# Patient Record
Sex: Female | Born: 1970 | Race: White | Hispanic: No | Marital: Married | Smoking: Former smoker
Health system: Southern US, Community
[De-identification: ages and names within clinical notes are randomized; demographics above are authoritative.]

## PROBLEM LIST (undated history)

## (undated) DIAGNOSIS — K219 Gastro-esophageal reflux disease without esophagitis: Secondary | ICD-10-CM

## (undated) DIAGNOSIS — N83202 Unspecified ovarian cyst, left side: Secondary | ICD-10-CM

## (undated) DIAGNOSIS — F32A Depression, unspecified: Secondary | ICD-10-CM

## (undated) DIAGNOSIS — N83201 Unspecified ovarian cyst, right side: Secondary | ICD-10-CM

## (undated) DIAGNOSIS — I10 Essential (primary) hypertension: Secondary | ICD-10-CM

## (undated) DIAGNOSIS — K5792 Diverticulitis of intestine, part unspecified, without perforation or abscess without bleeding: Secondary | ICD-10-CM

## (undated) DIAGNOSIS — Z87442 Personal history of urinary calculi: Secondary | ICD-10-CM

## (undated) DIAGNOSIS — J45909 Unspecified asthma, uncomplicated: Secondary | ICD-10-CM

## (undated) HISTORY — PX: OVARIAN CYST SURGERY: SHX726

## (undated) HISTORY — PX: OTHER SURGICAL HISTORY: SHX169

---

## 1998-07-25 ENCOUNTER — Other Ambulatory Visit: Admission: RE | Admit: 1998-07-25 | Discharge: 1998-07-25 | Payer: Self-pay | Admitting: Obstetrics and Gynecology

## 1999-04-09 ENCOUNTER — Emergency Department (HOSPITAL_COMMUNITY): Admission: EM | Admit: 1999-04-09 | Discharge: 1999-04-09 | Payer: Self-pay | Admitting: *Deleted

## 1999-09-10 ENCOUNTER — Other Ambulatory Visit: Admission: RE | Admit: 1999-09-10 | Discharge: 1999-09-10 | Payer: Self-pay | Admitting: Obstetrics and Gynecology

## 1999-11-06 ENCOUNTER — Encounter: Payer: Self-pay | Admitting: *Deleted

## 1999-11-06 ENCOUNTER — Ambulatory Visit (HOSPITAL_COMMUNITY): Admission: RE | Admit: 1999-11-06 | Discharge: 1999-11-06 | Payer: Self-pay | Admitting: *Deleted

## 2000-11-14 ENCOUNTER — Other Ambulatory Visit: Admission: RE | Admit: 2000-11-14 | Discharge: 2000-11-14 | Payer: Self-pay | Admitting: Obstetrics and Gynecology

## 2001-12-04 ENCOUNTER — Other Ambulatory Visit: Admission: RE | Admit: 2001-12-04 | Discharge: 2001-12-04 | Payer: Self-pay | Admitting: Obstetrics & Gynecology

## 2003-08-17 ENCOUNTER — Other Ambulatory Visit: Admission: RE | Admit: 2003-08-17 | Discharge: 2003-08-17 | Payer: Self-pay | Admitting: Obstetrics & Gynecology

## 2006-10-13 ENCOUNTER — Emergency Department (HOSPITAL_COMMUNITY): Admission: EM | Admit: 2006-10-13 | Discharge: 2006-10-13 | Payer: Self-pay | Admitting: Emergency Medicine

## 2008-07-07 ENCOUNTER — Encounter: Admission: RE | Admit: 2008-07-07 | Discharge: 2008-07-07 | Payer: Self-pay | Admitting: Psychiatry

## 2008-10-26 ENCOUNTER — Emergency Department (HOSPITAL_COMMUNITY): Admission: EM | Admit: 2008-10-26 | Discharge: 2008-10-26 | Payer: Self-pay | Admitting: Emergency Medicine

## 2008-10-28 ENCOUNTER — Encounter: Admission: RE | Admit: 2008-10-28 | Discharge: 2008-10-28 | Payer: Self-pay | Admitting: Internal Medicine

## 2008-11-25 ENCOUNTER — Encounter: Admission: RE | Admit: 2008-11-25 | Discharge: 2008-11-25 | Payer: Self-pay | Admitting: Internal Medicine

## 2010-11-15 LAB — URINALYSIS, ROUTINE W REFLEX MICROSCOPIC
Bilirubin Urine: NEGATIVE
Glucose, UA: NEGATIVE mg/dL
Hgb urine dipstick: NEGATIVE
Ketones, ur: NEGATIVE mg/dL
Nitrite: NEGATIVE
Protein, ur: 30 mg/dL — AB
Specific Gravity, Urine: 1.024 (ref 1.005–1.030)
Urobilinogen, UA: 0.2 mg/dL (ref 0.0–1.0)
pH: 8.5 — ABNORMAL HIGH (ref 5.0–8.0)

## 2010-11-15 LAB — COMPREHENSIVE METABOLIC PANEL
ALT: 14 U/L (ref 0–35)
AST: 16 U/L (ref 0–37)
Albumin: 3.5 g/dL (ref 3.5–5.2)
Alkaline Phosphatase: 58 U/L (ref 39–117)
BUN: 12 mg/dL (ref 6–23)
CO2: 25 mEq/L (ref 19–32)
Calcium: 8.5 mg/dL (ref 8.4–10.5)
Chloride: 103 mEq/L (ref 96–112)
Creatinine, Ser: 0.83 mg/dL (ref 0.4–1.2)
GFR calc Af Amer: >60 mL/min (ref >60)
GFR calc non Af Amer: >60 mL/min (ref >60)
Glucose, Bld: 106 mg/dL — ABNORMAL HIGH (ref 70–99)
Potassium: 3.8 mEq/L (ref 3.5–5.1)
Sodium: 135 mEq/L (ref 135–145)
Total Bilirubin: 0.4 mg/dL (ref 0.3–1.2)
Total Protein: 6.4 g/dL (ref 6.0–8.3)

## 2010-11-15 LAB — WET PREP, GENITAL
Trich, Wet Prep: NONE SEEN
Yeast Wet Prep HPF POC: NONE SEEN

## 2010-11-15 LAB — POCT I-STAT, CHEM 8
BUN: 14 mg/dL (ref 6–23)
Calcium, Ion: 1.01 mmol/L — ABNORMAL LOW (ref 1.12–1.32)
Chloride: 108 mEq/L (ref 96–112)
Creatinine, Ser: 1 mg/dL (ref 0.4–1.2)
Glucose, Bld: 105 mg/dL — ABNORMAL HIGH (ref 70–99)
HCT: 43 % (ref 36.0–46.0)
Hemoglobin: 14.6 g/dL (ref 12.0–15.0)
Potassium: 3.9 mEq/L (ref 3.5–5.1)
Sodium: 138 mEq/L (ref 135–145)
TCO2: 24 mmol/L (ref 0–100)

## 2010-11-15 LAB — CBC
HCT: 41.2 % (ref 36.0–46.0)
Hemoglobin: 14.2 g/dL (ref 12.0–15.0)
MCHC: 34.4 g/dL (ref 30.0–36.0)
MCV: 90.8 fL (ref 78.0–100.0)
Platelets: 196 10*3/uL (ref 150–400)
RBC: 4.53 MIL/uL (ref 3.87–5.11)
RDW: 12.9 % (ref 11.5–15.5)
WBC: 13.1 10*3/uL — ABNORMAL HIGH (ref 4.0–10.5)

## 2010-11-15 LAB — DIFFERENTIAL
Basophils Absolute: 0.1 10*3/uL (ref 0.0–0.1)
Basophils Relative: 1 % (ref 0–1)
Eosinophils Absolute: 0.2 10*3/uL (ref 0.0–0.7)
Eosinophils Relative: 1 % (ref 0–5)
Lymphocytes Relative: 16 % (ref 12–46)
Lymphs Abs: 2.1 10*3/uL (ref 0.7–4.0)
Monocytes Absolute: 1 10*3/uL (ref 0.1–1.0)
Monocytes Relative: 7 % (ref 3–12)
Neutro Abs: 9.7 10*3/uL — ABNORMAL HIGH (ref 1.7–7.7)
Neutrophils Relative %: 74 % (ref 43–77)

## 2010-11-15 LAB — URINE MICROSCOPIC-ADD ON

## 2010-11-15 LAB — GC/CHLAMYDIA PROBE AMP, GENITAL
Chlamydia, DNA Probe: NEGATIVE
GC Probe Amp, Genital: NEGATIVE

## 2010-11-15 LAB — LIPASE, BLOOD: Lipase: 18 U/L (ref 11–59)

## 2010-11-15 LAB — POCT PREGNANCY, URINE: Preg Test, Ur: NEGATIVE

## 2013-01-20 ENCOUNTER — Other Ambulatory Visit: Payer: Self-pay | Admitting: Internal Medicine

## 2013-01-20 DIAGNOSIS — Z1231 Encounter for screening mammogram for malignant neoplasm of breast: Secondary | ICD-10-CM

## 2013-02-22 ENCOUNTER — Ambulatory Visit
Admission: RE | Admit: 2013-02-22 | Discharge: 2013-02-22 | Disposition: A | Discharge status: Home / Self Care | Payer: Commercial Indemnity | Source: Ambulatory Visit | Attending: Internal Medicine | Admitting: Internal Medicine

## 2013-02-22 DIAGNOSIS — Z1231 Encounter for screening mammogram for malignant neoplasm of breast: Secondary | ICD-10-CM

## 2013-10-19 ENCOUNTER — Ambulatory Visit: Payer: Self-pay

## 2013-10-19 ENCOUNTER — Ambulatory Visit (INDEPENDENT_AMBULATORY_CARE_PROVIDER_SITE_OTHER): Payer: Managed Care, Other (non HMO) | Admitting: Podiatry

## 2013-10-19 ENCOUNTER — Encounter: Payer: Self-pay | Admitting: Podiatry

## 2013-10-19 VITALS — BP 119/72 | HR 64 | Resp 16 | Ht 64.0 in | Wt 190.0 lb

## 2013-10-19 DIAGNOSIS — M722 Plantar fascial fibromatosis: Secondary | ICD-10-CM

## 2013-10-19 MED ORDER — DICLOFENAC SODIUM 75 MG PO TBEC: 75.0000 mg | DELAYED_RELEASE_TABLET | Freq: Two times a day (BID) | ORAL | Status: DC

## 2013-10-19 MED ORDER — METHYLPREDNISOLONE (PAK) 4 MG PO TABS: ORAL_TABLET | ORAL | Status: DC

## 2013-10-19 NOTE — Patient Instructions (Signed)
Plantar Fasciitis (Heel Spur Syndrome) with Rehab The plantar fascia is a fibrous, ligament-like, soft-tissue structure that spans the bottom of the foot. Plantar fasciitis is a condition that causes pain in the foot due to inflammation of the tissue. SYMPTOMS   Pain and tenderness on the underneath side of the foot.  Pain that worsens with standing or walking. CAUSES  Plantar fasciitis is caused by irritation and injury to the plantar fascia on the underneath side of the foot. Common mechanisms of injury include:  Direct trauma to bottom of the foot.  Damage to a small nerve that runs under the foot where the main fascia attaches to the heel bone.  Stress placed on the plantar fascia due to bone spurs. RISK INCREASES WITH:   Activities that place stress on the plantar fascia (running, jumping, pivoting, or cutting).  Poor strength and flexibility.  Improperly fitted shoes.  Tight calf muscles.  Flat feet.  Failure to warm-up properly before activity.  Obesity. PREVENTION  Warm up and stretch properly before activity.  Allow for adequate recovery between workouts.  Maintain physical fitness:  Strength, flexibility, and endurance.  Cardiovascular fitness.  Maintain a health body weight.  Avoid stress on the plantar fascia.  Wear properly fitted shoes, including arch supports for individuals who have flat feet. PROGNOSIS  If treated properly, then the symptoms of plantar fasciitis usually resolve without surgery. However, occasionally surgery is necessary. RELATED COMPLICATIONS   Recurrent symptoms that may result in a chronic condition.  Problems of the lower back that are caused by compensating for the injury, such as limping.  Pain or weakness of the foot during push-off following surgery.  Chronic inflammation, scarring, and partial or complete fascia tear, occurring more often from repeated injections. TREATMENT  Treatment initially involves the use of  ice and medication to help reduce pain and inflammation. The use of strengthening and stretching exercises may help reduce pain with activity, especially stretches of the Achilles tendon. These exercises may be performed at home or with a therapist. Your caregiver may recommend that you use heel cups of arch supports to help reduce stress on the plantar fascia. Occasionally, corticosteroid injections are given to reduce inflammation. If symptoms persist for greater than 6 months despite non-surgical (conservative), then surgery may be recommended.  MEDICATION   If pain medication is necessary, then nonsteroidal anti-inflammatory medications, such as aspirin and ibuprofen, or other minor pain relievers, such as acetaminophen, are often recommended.  Do not take pain medication within 7 days before surgery.  Prescription pain relievers may be given if deemed necessary by your caregiver. Use only as directed and only as much as you need.  Corticosteroid injections may be given by your caregiver. These injections should be reserved for the most serious cases, because they may only be given a certain number of times. HEAT AND COLD  Cold treatment (icing) relieves pain and reduces inflammation. Cold treatment should be applied for 10 to 15 minutes every 2 to 3 hours for inflammation and pain and immediately after any activity that aggravates your symptoms. Use ice packs or massage the area with a piece of ice (ice massage).  Heat treatment may be used prior to performing the stretching and strengthening activities prescribed by your caregiver, physical therapist, or athletic trainer. Use a heat pack or soak the injury in warm water. SEEK IMMEDIATE MEDICAL CARE IF:  Treatment seems to offer no benefit, or the condition worsens.  Any medications produce adverse side effects. EXERCISES RANGE   OF MOTION (ROM) AND STRETCHING EXERCISES - Plantar Fasciitis (Heel Spur Syndrome) These exercises may help you  when beginning to rehabilitate your injury. Your symptoms may resolve with or without further involvement from your physician, physical therapist or athletic trainer. While completing these exercises, remember:   Restoring tissue flexibility helps normal motion to return to the joints. This allows healthier, less painful movement and activity.  An effective stretch should be held for at least 30 seconds.  A stretch should never be painful. You should only feel a gentle lengthening or release in the stretched tissue. RANGE OF MOTION - Toe Extension, Flexion  Sit with your right / left leg crossed over your opposite knee.  Grasp your toes and gently pull them back toward the top of your foot. You should feel a stretch on the bottom of your toes and/or foot.  Hold this stretch for __________ seconds.  Now, gently pull your toes toward the bottom of your foot. You should feel a stretch on the top of your toes and or foot.  Hold this stretch for __________ seconds. Repeat __________ times. Complete this stretch __________ times per day.  RANGE OF MOTION - Ankle Dorsiflexion, Active Assisted  Remove shoes and sit on a chair that is preferably not on a carpeted surface.  Place right / left foot under knee. Extend your opposite leg for support.  Keeping your heel down, slide your right / left foot back toward the chair until you feel a stretch at your ankle or calf. If you do not feel a stretch, slide your bottom forward to the edge of the chair, while still keeping your heel down.  Hold this stretch for __________ seconds. Repeat __________ times. Complete this stretch __________ times per day.  STRETCH  Gastroc, Standing  Place hands on wall.  Extend right / left leg, keeping the front knee somewhat bent.  Slightly point your toes inward on your back foot.  Keeping your right / left heel on the floor and your knee straight, shift your weight toward the wall, not allowing your back to  arch.  You should feel a gentle stretch in the right / left calf. Hold this position for __________ seconds. Repeat __________ times. Complete this stretch __________ times per day. STRETCH  Soleus, Standing  Place hands on wall.  Extend right / left leg, keeping the other knee somewhat bent.  Slightly point your toes inward on your back foot.  Keep your right / left heel on the floor, bend your back knee, and slightly shift your weight over the back leg so that you feel a gentle stretch deep in your back calf.  Hold this position for __________ seconds. Repeat __________ times. Complete this stretch __________ times per day. STRETCH  Gastrocsoleus, Standing  Note: This exercise can place a lot of stress on your foot and ankle. Please complete this exercise only if specifically instructed by your caregiver.   Place the ball of your right / left foot on a step, keeping your other foot firmly on the same step.  Hold on to the wall or a rail for balance.  Slowly lift your other foot, allowing your body weight to press your heel down over the edge of the step.  You should feel a stretch in your right / left calf.  Hold this position for __________ seconds.  Repeat this exercise with a slight bend in your right / left knee. Repeat __________ times. Complete this stretch __________ times per day.    STRENGTHENING EXERCISES - Plantar Fasciitis (Heel Spur Syndrome)  These exercises may help you when beginning to rehabilitate your injury. They may resolve your symptoms with or without further involvement from your physician, physical therapist or athletic trainer. While completing these exercises, remember:   Muscles can gain both the endurance and the strength needed for everyday activities through controlled exercises.  Complete these exercises as instructed by your physician, physical therapist or athletic trainer. Progress the resistance and repetitions only as guided. STRENGTH - Towel  Curls  Sit in a chair positioned on a non-carpeted surface.  Place your foot on a towel, keeping your heel on the floor.  Pull the towel toward your heel by only curling your toes. Keep your heel on the floor.  If instructed by your physician, physical therapist or athletic trainer, add ____________________ at the end of the towel. Repeat __________ times. Complete this exercise __________ times per day. STRENGTH - Ankle Inversion  Secure one end of a rubber exercise band/tubing to a fixed object (table, pole). Loop the other end around your foot just before your toes.  Place your fists between your knees. This will focus your strengthening at your ankle.  Slowly, pull your big toe up and in, making sure the band/tubing is positioned to resist the entire motion.  Hold this position for __________ seconds.  Have your muscles resist the band/tubing as it slowly pulls your foot back to the starting position. Repeat __________ times. Complete this exercises __________ times per day.  Document Released: 07/22/2005 Document Revised: 10/14/2011 Document Reviewed: 11/03/2008 [employer] Patient Information 2014 [employer]. Plantar Fasciitis Plantar fasciitis is a common condition that causes foot pain. It is soreness (inflammation) of the band of tough fibrous tissue on the bottom of the foot that runs from the heel bone (calcaneus) to the ball of the foot. The cause of this soreness may be from excessive standing, poor fitting shoes, running on hard surfaces, being overweight, having an abnormal walk, or overuse (this is common in runners) of the painful foot or feet. It is also common in aerobic exercise dancers and ballet dancers. SYMPTOMS  Most people with plantar fasciitis complain of:  Severe pain in the morning on the bottom of their foot especially when taking the first steps out of bed. This pain recedes after a few minutes of walking.  Severe pain is experienced also during walking  following a long period of inactivity.  Pain is worse when walking barefoot or up stairs DIAGNOSIS   Your caregiver will diagnose this condition by examining and feeling your foot.  Special tests such as X-rays of your foot, are usually not needed. PREVENTION   Consult a sports medicine professional before beginning a new exercise program.  Walking programs offer a good workout. With walking there is a lower chance of overuse injuries common to runners. There is less impact and less jarring of the joints.  Begin all new exercise programs slowly. If problems or pain develop, decrease the amount of time or distance until you are at a comfortable level.  Wear good shoes and replace them regularly.  Stretch your foot and the heel cords at the back of the ankle (Achilles tendon) both before and after exercise.  Run or exercise on even surfaces that are not hard. For example, asphalt is better than pavement.  Do not run barefoot on hard surfaces.  If using a treadmill, vary the incline.  Do not continue to workout if you have foot or joint   problems. Seek professional help if they do not improve. HOME CARE INSTRUCTIONS   Avoid activities that cause you pain until you recover.  Use ice or cold packs on the problem or painful areas after working out.  Only take over-the-counter or prescription medicines for pain, discomfort, or fever as directed by your caregiver.  Soft shoe inserts or athletic shoes with air or gel sole cushions may be helpful.  If problems continue or become more severe, consult a sports medicine caregiver or your own health care provider. Cortisone is a potent anti-inflammatory medication that may be injected into the painful area. You can discuss this treatment with your caregiver. MAKE SURE YOU:   Understand these instructions.  Will watch your condition.  Will get help right away if you are not doing well or get worse. Document Released: 04/16/2001 Document  Revised: 10/14/2011 Document Reviewed: 06/15/2008 [employer] Patient Information 2014 [employer].  

## 2013-10-19 NOTE — Progress Notes (Signed)
Still having terrible heel pain in the left heel, tried the meloxicam , and strapped the feet .  Objective: Vital signs are stable alert and oriented x3 pulses are palpable left foot. Pain on palpation medial continued tubercle of the left heel.  Assessment: Plantar fasciitis left.  Plan: Continue conservative therapies reinjected the left heel today put her in a plantar fascial brace night splint.

## 2013-11-16 ENCOUNTER — Ambulatory Visit (INDEPENDENT_AMBULATORY_CARE_PROVIDER_SITE_OTHER): Payer: Managed Care, Other (non HMO) | Admitting: Podiatry

## 2013-11-16 ENCOUNTER — Encounter: Payer: Self-pay | Admitting: Podiatry

## 2013-11-16 VITALS — BP 126/72 | HR 65 | Resp 15 | Ht 64.0 in | Wt 190.0 lb

## 2013-11-16 DIAGNOSIS — M722 Plantar fascial fibromatosis: Secondary | ICD-10-CM

## 2013-11-17 NOTE — Progress Notes (Signed)
She presents today for followup of her plantar fasciitis left states she's approximately 90% better. She continues all conservative therapies.  Objective: Vital signs are stable she is alert and oriented x3. She has no pain on palpation medial continued tubercle of the left heel. Pulses remain palpable there is no calf pain.  Assessment: Plantar fasciitis left foot resolving and 90%.  Plan: Continue all conservative therapies including icing night splint fascial brace shoe gear and anti-inflammatories.

## 2014-08-30 ENCOUNTER — Ambulatory Visit (INDEPENDENT_AMBULATORY_CARE_PROVIDER_SITE_OTHER): Payer: Managed Care, Other (non HMO)

## 2014-08-30 ENCOUNTER — Ambulatory Visit (INDEPENDENT_AMBULATORY_CARE_PROVIDER_SITE_OTHER): Payer: Managed Care, Other (non HMO) | Admitting: Podiatry

## 2014-08-30 VITALS — BP 138/94 | HR 71 | Resp 16

## 2014-08-30 DIAGNOSIS — M722 Plantar fascial fibromatosis: Secondary | ICD-10-CM

## 2014-08-30 MED ORDER — METHYLPREDNISOLONE (PAK) 4 MG PO TABS: ORAL_TABLET | ORAL | Status: DC

## 2014-08-30 MED ORDER — DICLOFENAC SODIUM 75 MG PO TBEC: 75.0000 mg | DELAYED_RELEASE_TABLET | Freq: Two times a day (BID) | ORAL | Status: DC

## 2014-08-30 NOTE — Progress Notes (Signed)
She presents today states that the left heel is acting up with the right one has recently started bothering me as well. I saw her last in April 2015 at which time she was 90% better. She has changed jobs and is now walking in [employer] approximately 9 miles a day.  Objective: Vital signs are stable alert and oriented 3 pulses are palpable bilateral. Pain on palpation medial calcaneal tubercles bilateral. Radiographic findings are consistent with plantar distally oriented calcaneal heel spur and soft tissue increase in density at the plantar fascial calcaneal insertion site indicative of plantar fasciitis right heel.  Assessment: Plantar fasciitis bilateral left greater than right.  Plan: At this point I encouraged her to purchase a pair orthotics which she declined. I injected the bilateral heels today with Kenalog and local anesthetic. We started over with her Medrol Dosepak to be followed by diclofenac. She will continue use the night splint and she was dispensed plantar fascial braces today. We discussed appropriate shoe gear stretching exercise ice therapy and sugar modifications I will follow-up with her in the near future.

## 2014-09-29 ENCOUNTER — Ambulatory Visit: Payer: Managed Care, Other (non HMO) | Admitting: Podiatry

## 2015-03-03 ENCOUNTER — Encounter (HOSPITAL_COMMUNITY): Payer: Self-pay | Admitting: *Deleted

## 2015-03-03 ENCOUNTER — Emergency Department (HOSPITAL_COMMUNITY)
Admission: EM | Admit: 2015-03-03 | Discharge: 2015-03-03 | Disposition: A | Discharge status: Home / Self Care | Payer: Managed Care, Other (non HMO) | Attending: Emergency Medicine | Admitting: Emergency Medicine

## 2015-03-03 ENCOUNTER — Emergency Department (HOSPITAL_COMMUNITY): Payer: Managed Care, Other (non HMO)

## 2015-03-03 DIAGNOSIS — Z791 Long term (current) use of non-steroidal anti-inflammatories (NSAID): Secondary | ICD-10-CM | POA: Insufficient documentation

## 2015-03-03 DIAGNOSIS — R1032 Left lower quadrant pain: Secondary | ICD-10-CM

## 2015-03-03 DIAGNOSIS — Z3202 Encounter for pregnancy test, result negative: Secondary | ICD-10-CM | POA: Insufficient documentation

## 2015-03-03 DIAGNOSIS — N898 Other specified noninflammatory disorders of vagina: Secondary | ICD-10-CM | POA: Insufficient documentation

## 2015-03-03 DIAGNOSIS — K5732 Diverticulitis of large intestine without perforation or abscess without bleeding: Secondary | ICD-10-CM | POA: Diagnosis not present

## 2015-03-03 HISTORY — DX: Unspecified ovarian cyst, right side: N83.202

## 2015-03-03 HISTORY — DX: Diverticulitis of intestine, part unspecified, without perforation or abscess without bleeding: K57.92

## 2015-03-03 HISTORY — DX: Unspecified ovarian cyst, right side: N83.201

## 2015-03-03 LAB — CBC WITH DIFFERENTIAL/PLATELET
Basophils Absolute: 0 10*3/uL (ref 0.0–0.1)
Basophils Relative: 0 % (ref 0–1)
Eosinophils Absolute: 0.1 10*3/uL (ref 0.0–0.7)
Eosinophils Relative: 1 % (ref 0–5)
HCT: 38.2 % (ref 36.0–46.0)
Hemoglobin: 12.8 g/dL (ref 12.0–15.0)
Lymphocytes Relative: 14 % (ref 12–46)
Lymphs Abs: 1.5 10*3/uL (ref 0.7–4.0)
MCH: 30 pg (ref 26.0–34.0)
MCHC: 33.5 g/dL (ref 30.0–36.0)
MCV: 89.7 fL (ref 78.0–100.0)
Monocytes Absolute: 0.8 10*3/uL (ref 0.1–1.0)
Monocytes Relative: 7 % (ref 3–12)
Neutro Abs: 8.9 10*3/uL — ABNORMAL HIGH (ref 1.7–7.7)
Neutrophils Relative %: 78 % — ABNORMAL HIGH (ref 43–77)
Platelets: 218 10*3/uL (ref 150–400)
RBC: 4.26 MIL/uL (ref 3.87–5.11)
RDW: 13.2 % (ref 11.5–15.5)
WBC: 11.4 10*3/uL — ABNORMAL HIGH (ref 4.0–10.5)

## 2015-03-03 LAB — WET PREP, GENITAL
Clue Cells Wet Prep HPF POC: NONE SEEN
Trich, Wet Prep: NONE SEEN
Yeast Wet Prep HPF POC: NONE SEEN

## 2015-03-03 LAB — URINALYSIS, ROUTINE W REFLEX MICROSCOPIC
Bilirubin Urine: NEGATIVE
Glucose, UA: NEGATIVE mg/dL
Hgb urine dipstick: NEGATIVE
Ketones, ur: NEGATIVE mg/dL
Leukocytes, UA: NEGATIVE
Nitrite: NEGATIVE
Protein, ur: NEGATIVE mg/dL
Specific Gravity, Urine: 1.02 (ref 1.005–1.030)
Urobilinogen, UA: 0.2 mg/dL (ref 0.0–1.0)
pH: 5 (ref 5.0–8.0)

## 2015-03-03 LAB — COMPREHENSIVE METABOLIC PANEL
ALT: 14 U/L (ref 14–54)
AST: 15 U/L (ref 15–41)
Albumin: 3.4 g/dL — ABNORMAL LOW (ref 3.5–5.0)
Alkaline Phosphatase: 77 U/L (ref 38–126)
Anion gap: 8 (ref 5–15)
BUN: 8 mg/dL (ref 6–20)
CO2: 25 mmol/L (ref 22–32)
Calcium: 8.8 mg/dL — ABNORMAL LOW (ref 8.9–10.3)
Chloride: 104 mmol/L (ref 101–111)
Creatinine, Ser: 0.8 mg/dL (ref 0.44–1.00)
GFR calc Af Amer: >60 mL/min (ref >60)
GFR calc non Af Amer: >60 mL/min (ref >60)
Glucose, Bld: 92 mg/dL (ref 65–99)
Potassium: 3.6 mmol/L (ref 3.5–5.1)
Sodium: 137 mmol/L (ref 135–145)
Total Bilirubin: 0.7 mg/dL (ref 0.3–1.2)
Total Protein: 6.7 g/dL (ref 6.5–8.1)

## 2015-03-03 LAB — LIPASE, BLOOD: Lipase: 14 U/L — ABNORMAL LOW (ref 22–51)

## 2015-03-03 LAB — PREGNANCY, URINE: Preg Test, Ur: NEGATIVE

## 2015-03-03 MED ORDER — HYDROMORPHONE HCL 1 MG/ML IJ SOLN
1.0000 mg | Freq: Once | INTRAMUSCULAR | Status: AC
  Administered 2015-03-03: 1 mg via INTRAVENOUS
  Filled 2015-03-03: qty 1

## 2015-03-03 MED ORDER — SODIUM CHLORIDE 0.9 % IV BOLUS (SEPSIS)
1000.0000 mL | Freq: Once | INTRAVENOUS | Status: AC
Start: 2015-03-03 — End: 2015-03-03
  Administered 2015-03-03: 1000 mL via INTRAVENOUS

## 2015-03-03 MED ORDER — OXYCODONE-ACETAMINOPHEN 5-325 MG PO TABS: 1.0000 | ORAL_TABLET | Freq: Four times a day (QID) | ORAL | Status: DC | PRN

## 2015-03-03 MED ORDER — ONDANSETRON HCL 4 MG PO TABS: 4.0000 mg | ORAL_TABLET | Freq: Three times a day (TID) | ORAL | Status: DC | PRN

## 2015-03-03 MED ORDER — HYDROMORPHONE HCL 1 MG/ML IJ SOLN: 1.0000 mg | Freq: Once | INTRAMUSCULAR | Status: DC

## 2015-03-03 MED ORDER — CIPROFLOXACIN HCL 500 MG PO TABS: 500.0000 mg | ORAL_TABLET | Freq: Two times a day (BID) | ORAL | Status: DC

## 2015-03-03 MED ORDER — ONDANSETRON HCL 4 MG/2ML IJ SOLN
4.0000 mg | Freq: Once | INTRAMUSCULAR | Status: AC
  Administered 2015-03-03: 4 mg via INTRAVENOUS
  Filled 2015-03-03: qty 2

## 2015-03-03 MED ORDER — METRONIDAZOLE 500 MG PO TABS: 500.0000 mg | ORAL_TABLET | Freq: Three times a day (TID) | ORAL | Status: DC

## 2015-03-03 MED ORDER — IOHEXOL 350 MG/ML SOLN
100.0000 mL | Freq: Once | INTRAVENOUS | Status: AC | PRN
  Administered 2015-03-03: 100 mL via INTRAVENOUS

## 2015-03-03 MED ORDER — SODIUM CHLORIDE 0.9 % IV BOLUS (SEPSIS)
1000.0000 mL | Freq: Once | INTRAVENOUS | Status: AC
  Administered 2015-03-03: 1000 mL via INTRAVENOUS

## 2015-03-03 NOTE — ED Notes (Signed)
Pt states acute onset LLQ pain at 0230 this am.  She states she has a hx of diverticulitis and the pain is in the same place as her typical flare ups.  However, she also has a hx of ovarian cysts and the pain feels more like a cyst rupture.  States decreased appetite x 1 week and green/black stool this am.

## 2015-03-03 NOTE — ED Provider Notes (Signed)
CSN: [id number]     Arrival date & time 03/03/15  1029 History   First MD Initiated Contact with Patient 03/03/15 1110     Chief Complaint  Patient presents with  . Abdominal Pain     (Consider location/radiation/quality/duration/timing/severity/associated sxs/prior Treatment) HPI Patient is a 44 year old female with past medical history of diverticulitis, bilateral ovarian cysts who presents the ER complaining of left lower quadrant abdominal pain. Patient states her symptoms began gradually several days ago, at since persisted. Patient reports an acute worsening of her pain around 2:30 AM this morning. Patient reports dark green diarrhea approximately twice a day for the past several days and she has been expressing pain as well. Patient reports mild nausea without vomiting. Patient states she's had diverticulitis in the past which has felt somewhat similar, as well as ovarian cysts. Patient states this pain feels more similar to her ovarian cysts she has experienced in the past. Patient denies chest pain, shortness of breath, fever, chills, dysuria, hematochezia, melena.  Past Medical History  Diagnosis Date  . Diverticulitis   . Bilateral ovarian cysts    History reviewed. No pertinent past surgical history. No family history on file. History  Substance Use Topics  . Smoking status: Never Smoker   . Smokeless tobacco: Not on file  . Alcohol Use: Yes     Comment: social    OB History    No data available     Review of Systems  Constitutional: Negative for fever.  HENT: Negative for trouble swallowing.   Eyes: Negative for visual disturbance.  Respiratory: Negative for shortness of breath.   Cardiovascular: Negative for chest pain.  Gastrointestinal: Positive for nausea, abdominal pain and diarrhea. Negative for vomiting.  Genitourinary: Negative for dysuria.  Musculoskeletal: Negative for neck pain.  Skin: Negative for rash.  Neurological: Negative for dizziness, weakness  and numbness.  Psychiatric/Behavioral: Negative.     Allergies  Zithromax  Home Medications   Prior to Admission medications   Medication Sig Start Date End Date Taking? Authorizing Provider  ciprofloxacin (CIPRO) 500 MG tablet Take 1 tablet (500 mg total) by mouth 2 (two) times daily. One po bid x 7 days 03/03/15   Ladona Mow, PA-C  diclofenac (VOLTAREN) 75 MG EC tablet Take 1 tablet (75 mg total) by mouth 2 (two) times daily. 08/30/14   Max T Hyatt, DPM  methylPREDNIsolone (MEDROL DOSPACK) 4 MG tablet follow package directions 08/30/14   Max T Hyatt, DPM  metroNIDAZOLE (FLAGYL) 500 MG tablet Take 1 tablet (500 mg total) by mouth 3 (three) times daily. 03/03/15   Ladona Mow, PA-C  ondansetron (ZOFRAN) 4 MG tablet Take 1 tablet (4 mg total) by mouth every 8 (eight) hours as needed. 03/03/15   Ladona Mow, PA-C  oxyCODONE-acetaminophen (PERCOCET) 5-325 MG per tablet Take 1-2 tablets by mouth every 6 (six) hours as needed. 03/03/15   Ladona Mow, PA-C   BP 120/67 mmHg  Pulse 70  Temp(Src) 98.3 F (36.8 C) (Oral)  Resp 16  Ht  (1.626 m)  Wt 200 lb (90.719 kg)  BMI 34.31 kg/m2  SpO2 99%  LMP 02/20/2015 Physical Exam  Constitutional: She is oriented to person, place, and time. She appears well-developed and well-nourished. No distress.  HENT:  Head: Normocephalic and atraumatic.  Mouth/Throat: Oropharynx is clear and moist. No oropharyngeal exudate.  Eyes: Right eye exhibits no discharge. Left eye exhibits no discharge. No scleral icterus.  Neck: Normal range of motion.  Cardiovascular: Normal rate, regular rhythm  and normal heart sounds.   No murmur heard. Pulmonary/Chest: Effort normal and breath sounds normal. No respiratory distress.  Abdominal: Soft. Normal appearance. There is tenderness in the left lower quadrant. There is no rigidity, no guarding, no tenderness at McBurney's point and negative Murphy's sign.  Genitourinary: There is no rash, tenderness, lesion or injury on the  right labia. There is no rash, tenderness, lesion or injury on the left labia. Right adnexum displays no mass, no tenderness and no fullness. Left adnexum displays no mass, no tenderness and no fullness. No erythema, tenderness or bleeding in the vagina. No foreign body around the vagina. No signs of injury around the vagina. Vaginal discharge found.  Small amount of white colored discharge noted in vaginal vault. No cervical motion tenderness, friability or discharge. No adnexal tenderness. Chaperone present during entire pelvic exam.  Musculoskeletal: Normal range of motion. She exhibits no edema or tenderness.  Neurological: She is alert and oriented to person, place, and time. No cranial nerve deficit. Coordination normal.  Skin: Skin is warm and dry. No rash noted. She is not diaphoretic.  Psychiatric: She has a normal mood and affect.  Nursing note and vitals reviewed.   ED Course  Procedures (including critical care time) Labs Review Labs Reviewed  WET PREP, GENITAL - Abnormal; Notable for the following:    WBC, Wet Prep HPF POC FEW (*)    All other components within normal limits  URINALYSIS, ROUTINE W REFLEX MICROSCOPIC (NOT AT [facility]) - Abnormal; Notable for the following:    APPearance CLOUDY (*)    All other components within normal limits  CBC WITH DIFFERENTIAL/PLATELET - Abnormal; Notable for the following:    WBC 11.4 (*)    Neutrophils Relative % 78 (*)    Neutro Abs 8.9 (*)    All other components within normal limits  COMPREHENSIVE METABOLIC PANEL - Abnormal; Notable for the following:    Calcium 8.8 (*)    Albumin 3.4 (*)    All other components within normal limits  LIPASE, BLOOD - Abnormal; Notable for the following:    Lipase 14 (*)    All other components within normal limits  PREGNANCY, URINE  LIPASE, BLOOD  COMPREHENSIVE METABOLIC PANEL  GC/CHLAMYDIA PROBE AMP ([place]) NOT AT [facility]    Imaging Review Ct Abdomen Pelvis W Contrast  03/03/2015   CLINICAL  DATA:  44 year old female with acute left abdominal and pelvic pain today.  EXAM: CT ABDOMEN AND PELVIS WITH CONTRAST  TECHNIQUE: Multidetector CT imaging of the abdomen and pelvis was performed using the standard protocol following bolus administration of intravenous contrast.  CONTRAST:  OMNIPAQUE IOHEXOL 350 MG/ML SOLN  COMPARISON:  10/28/2008 and prior CTs  FINDINGS: Lower chest:  Unremarkable  Hepatobiliary: The gallbladder and liver are unremarkable. There is no evidence of biliary dilatation.  Pancreas: Unremarkable  Spleen: Unremarkable  Adrenals/Urinary Tract: The kidneys, adrenal glands and bladder are unremarkable.  Stomach/Bowel: There is focal wall thickening and adjacent inflammation of the descending descending colon as well as the proximal sigmoid colon, compatible with diverticulitis. There is no evidence of abscess, pneumoperitoneum or bowel obstruction. A trace amount of free pelvic and right paracolic gutter fluid noted.  Vascular/Lymphatic: No enlarged lymph nodes or abdominal aortic aneurysm. Aortic atherosclerotic calcifications noted.  Reproductive: Uterus and adnexal regions are unremarkable.  Other: Trace amount of free fluid noted.  Musculoskeletal: No acute or suspicious abnormalities.  IMPRESSION: Focal wall thickening and adjacent inflammation of the distal descending and  proximal sigmoid colon compatible with diverticulitis. Trace amount of free pelvic and right paracolic gutter fluid. No evidence of pneumoperitoneum, abscess or bowel obstruction.  Aortic atherosclerotic disease.   Electronically Signed   By: Harmon Pier M.D.   On: 03/03/2015 15:21     EKG Interpretation None      MDM   Final diagnoses:  LLQ abdominal pain  Diverticulitis large intestine w/o perforation or abscess w/o bleeding    Patient left lower quadrant abdominal pain stating her symptoms are consistent with either diverticulitis versus ovarian cyst. Patient stating she is unsure of which her  symptoms feel more like. Pelvic exam unremarkable for any adnexal tenderness, will follow with CT abdomen pelvis to evaluate for diverticular disease.  CT with impression: Focal wall thickening and adjacent inflammation of the distal descending and proximal sigmoid colon compatible with diverticulitis. Trace amount of free pelvic and right paracolic gutter fluid. No evidence of pneumoperitoneum, abscess or bowel obstruction.  Aortic atherosclerotic disease.  Patient's Symptoms Consistent with Diverticulitis. This Confirmed by CT. Labs are unremarkable for acute pathology. No leukocytosis or anemia. No hematochezia or melena by history. No concern for diverticular bleed. Patient afebrile, hemodynamically stable and in no acute distress. Symptoms are controlled here, patient tolerating by mouth well. No concern for sepsis or SIRS. Patient is stable for discharge to continue antibiotic therapy as outpatient. Discussed return precautions with patient, and strongly encouraged patient follow up PCP. Patient verbalizes understanding and agreement of this plan.  BP 120/67 mmHg  Pulse 70  Temp(Src) 98.3 F (36.8 C) (Oral)  Resp 16  Ht 5\' 4"  (1.626 m)  Wt 200 lb (90.719 kg)  BMI 34.31 kg/m2  SpO2 99%  LMP 02/20/2015  Signed,  Ladona Mow, PA-C 4:40 PM  Patient discussed with Dr Nelva Nay, MD   Ladona Mow, PA-C 03/03/15 1640  Nelva Nay, MD 03/04/15 [phone]

## 2015-03-03 NOTE — Discharge Instructions (Signed)

## 2015-03-06 LAB — GC/CHLAMYDIA PROBE AMP (~~LOC~~) NOT AT ARMC
Chlamydia: NEGATIVE
Neisseria Gonorrhea: NEGATIVE

## 2015-06-05 ENCOUNTER — Encounter: Payer: Self-pay | Admitting: Podiatry

## 2015-06-05 ENCOUNTER — Ambulatory Visit (INDEPENDENT_AMBULATORY_CARE_PROVIDER_SITE_OTHER): Payer: Managed Care, Other (non HMO) | Admitting: Podiatry

## 2015-06-05 DIAGNOSIS — M629 Disorder of muscle, unspecified: Secondary | ICD-10-CM

## 2015-06-05 DIAGNOSIS — M722 Plantar fascial fibromatosis: Secondary | ICD-10-CM | POA: Diagnosis not present

## 2015-06-05 NOTE — Progress Notes (Signed)
Subjective:    Patient ID: Sandra Ashley, female    DOB: 1971-07-29, 44 y.o.   MRN: [id number]  HPI   44 year old female presents the office today for concerns her right foot pain. She was referred to me by Dr. Elijah Birk for surgical consultation. She states that she's had pain to her heel on the right second greater than 1 year. She was previously treated by Dr. Al Corpus for plantar fasciitis in which she had steroid injections. She is also tried stretching, icing, anti-inflammatories, steroids. In July  Of this year while at work she did have an injury for which she got on her toes and she felt a ripping sensation within the arch of her foot. She followed up with Dr. Elijah Birk at that time. She was told that she may have torn the ligament and she fractured her heel spur. She is placed into boot for about a week at that time. She is also had subsequent steroid injections and to the area. She states that she continues to have pain to the right heel throughout the day which gets worse as she walks on it. She has also had inserts which  Helped some. She states her pain is not as much today and she's been off her feet for the last week from work. No recent injury or trauma. No other complaints at this time.     Review of Systems  All other systems reviewed and are negative.      Objective:   Physical Exam General: AAO x3, NAD  Dermatological: Skin is warm, dry and supple bilateral. Nails x 10 are well manicured; remaining integument appears unremarkable at this time. There are no open sores, no preulcerative lesions, no rash or signs of infection present.  Vascular: Dorsalis Pedis artery and Posterior Tibial artery pedal pulses are 2/4 bilateral with immedate capillary fill time. Pedal hair growth present. No varicosities and no lower extremity edema present bilateral. There is no pain with calf compression, swelling, warmth, erythema.   Neruologic: Grossly intact via light touch bilateral. Vibratory intact  via tuning fork bilateral. Protective threshold with Semmes Wienstein monofilament intact to all pedal sites bilateral. Patellar and Achilles deep tendon reflexes 2+ bilateral. No Babinski or clonus noted bilateral.   Musculoskeletal: No gross boney pedal deformities bilateral. There is tenderness with patient on the plantar medial tubercle of the calcaneus at the insertion of the plantar fascia on the right foot. There is also mild discomfort on the medial band of the plantar fascia within the arch of foot on the right side. Upon dorsiflexion of the hallux the plantar fascia does not appear to be as taught compared to the left side. This concern for possible plantar fascial tear. There is no pain with lateral compression of the calcaneus. There is no pain on the course last insertion of the Achilles tendon. Mild equinus is present. No pain, crepitus, or limitation noted with foot and ankle range of motion bilateral. Muscular strength 5/5 in all groups tested bilateral.  Gait: Unassisted, Nonantalgic.      Assessment & Plan:  44 year old female with right heel pain , plantar fasciitis versus possible tear. -Treatment options discussed including all alternatives, risks, and complications -X-rays in Dr. Mercy Riding office were reviewed. -I'm concerned that given her history as well as clinical findings that she may have a plantar fascial tear. Because of this I ordered an MRI. This is for surgical planning as well. -Cue to the amount of pain now also recommend her to  continue with her CAM boot for now. She states that she can work and numbness. -Continue icing.  -Follow-up after MRI or sooner if any problems arise. In the meantime, encouraged to call the office with any questions, concerns, change in symptoms.  I did discuss both conservative and surgical treatment options however we will await the results of the MRI before proceeding with further treatment.  Ovid CurdMatthew Elayjah Chaney, DPM

## 2015-06-07 ENCOUNTER — Telehealth: Payer: Self-pay | Admitting: *Deleted

## 2015-06-07 NOTE — Telephone Encounter (Addendum)
Prior authorization started, faxed medical notes 06/05/2015, 08/30/2014 and x-ray report 08/30/2014 with CASE# [id number].  [facility] w/o contrast of right foot.  APPEAL PROCESS STARTED, documentation 08/30/2014 and 06/05/2015, x-ray report -08/30/2014, copy of the front of pt's insurance id card and 08/12/2014 letter by Dr. Ardelle AntonWagoner justifying the necessity for the MRI of the right foot, MAILED TO:  [facility], Attn: Appeals, [address], [address], [place], [place] [id number].  Left message informing pt to cancel MRI scheduled for 06/19/2015.  Pt asked if we had heard anything about the MRI approval and if not what else could she do for her discomfort.  Left message with Dr. Gabriel RungWagoner's orders and called in refills of Diclofenac to [facility] on [address].

## 2015-06-13 ENCOUNTER — Encounter: Payer: Self-pay | Admitting: Podiatry

## 2015-06-19 ENCOUNTER — Other Ambulatory Visit: Payer: Commercial Indemnity

## 2015-06-19 NOTE — Telephone Encounter (Signed)
I would stay in the CAM boot (which I believe that she has), NSAIDs if she can, ice

## 2015-06-20 ENCOUNTER — Telehealth: Payer: Self-pay | Admitting: *Deleted

## 2015-06-20 MED ORDER — DICLOFENAC SODIUM 75 MG PO TBEC
75.0000 mg | DELAYED_RELEASE_TABLET | Freq: Two times a day (BID) | ORAL | Status: DC
Start: 2015-06-20 — End: 2021-01-11

## 2015-06-20 NOTE — Telephone Encounter (Signed)
She has had all this stuff. She was previously seen by Dr. Al CorpusHyatt and Dr. Elijah Birkom

## 2015-06-20 NOTE — Telephone Encounter (Addendum)
[facility] [employer] denied the initial approval request and the appeal due to not enough physician directed PT, oral and injectable medications - steroid, ANSAIDS and narcotic pain management, immobilization and RICE, evident in the clinicals. Dr. Ardelle AntonWagoner states have pt come in to discuss other options.  Informed pt and transferred to the schedulers.

## 2015-06-21 ENCOUNTER — Telehealth: Payer: Self-pay | Admitting: *Deleted

## 2015-06-21 NOTE — Telephone Encounter (Signed)
[employer] letter dated 06/20/2015 at 6:38 pm, gives approval for MRI [id number], Reference [id number].

## 2015-06-22 ENCOUNTER — Telehealth: Payer: Self-pay | Admitting: *Deleted

## 2015-06-22 NOTE — Telephone Encounter (Addendum)
-----   Message from Vivi BarrackMatthew R Wagoner, DPM sent at 06/21/2015  6:56 PM EST ----- Regarding: RE: MRI approval Yes, and see me after the MRI. Thanks.  ----- Message -----    From: Marissa NestleValery D O'Connell, RN    Sent: 06/21/2015   8:38 AM      To: Vivi BarrackMatthew R Wagoner, DPM Subject: MRI approval                                   Dr. Ardelle AntonWagoner,  I swear to you, when I called the [employer] they denied the 1. Approval request - when all the clinicals were sent, then 2. Appeal request - when your letter and clinicals were sent, they gave me 6 different reasons.  Now I get a faxed letter 06/20/2015 at 638pm stating the MRI is approved.  Do you want me to get pt to have this even though she now has an appt?  Joya SanValery Faxed letter from [employer] dated 06/20/2015 at 6:38pm and [facility] pt [facility], so she can schedule MRI.  Original of the [employer] approval letter sent to be scanned into pt's record.

## 2015-06-22 NOTE — Telephone Encounter (Signed)
[employer] approval letter fax dated 06/20/2015 at 6:38pm, for MRI Right foot [id number], REFERENCE NUMBER: [id number], VALID 06/07/2015 TO 09/05/2015.  FAXED COPY OF LETTER TO [facility] WITH ORDERS AND SENT ORIGINAL TO BE SCANNED TO PT RECORD.

## 2015-07-07 ENCOUNTER — Ambulatory Visit
Admission: RE | Admit: 2015-07-07 | Discharge: 2015-07-07 | Disposition: A | Discharge status: Home / Self Care | Payer: Managed Care, Other (non HMO) | Source: Ambulatory Visit | Attending: Podiatry | Admitting: Podiatry

## 2015-07-07 DIAGNOSIS — M722 Plantar fascial fibromatosis: Secondary | ICD-10-CM

## 2015-07-14 ENCOUNTER — Encounter: Payer: Self-pay | Admitting: Podiatry

## 2015-07-14 ENCOUNTER — Ambulatory Visit (INDEPENDENT_AMBULATORY_CARE_PROVIDER_SITE_OTHER): Payer: Managed Care, Other (non HMO) | Admitting: Podiatry

## 2015-07-14 VITALS — BP 114/68 | HR 63 | Resp 18

## 2015-07-14 DIAGNOSIS — M722 Plantar fascial fibromatosis: Secondary | ICD-10-CM | POA: Diagnosis not present

## 2015-07-14 DIAGNOSIS — M629 Disorder of muscle, unspecified: Secondary | ICD-10-CM | POA: Diagnosis not present

## 2015-07-14 NOTE — Patient Instructions (Signed)

## 2015-07-17 ENCOUNTER — Ambulatory Visit: Payer: Managed Care, Other (non HMO) | Admitting: Podiatry

## 2015-07-20 DIAGNOSIS — M629 Disorder of muscle, unspecified: Secondary | ICD-10-CM | POA: Insufficient documentation

## 2015-07-20 NOTE — Progress Notes (Signed)
Patient ID: [name], female   DOB: Mar 19, 1971, 44 y.o.   MRN: [id number]  Subjective: Patient presents the office they for follow-up evaluation of right heel pain as well as to discuss MRI results and continuation of treatment. She states that she can views of pain to her heel. This has been ongoing for written one year and did worsen over the summer after an injury. She states that she continue to have pain on a daily basis that her heel she has difficulty walking.  Denies any systemic complaints such as fevers, chills, nausea, vomiting. No acute changes since last appointment, and no other complaints at this time.   Objective: AAO x3, NAD DP/PT pulses palpable bilaterally, CRT less than 3 seconds Protective sensation intact with Simms Weinstein monofilament, vibratory sensation intact, Achilles tendon reflex intact Tenderness to palpation along the plantar medial tubercle of the calcaneus at the insertion of plantar fascia on the right foot. There is no pain along the course of the plantar fascia within the arch of the foot. There is no pain with lateral compression of the calcaneus or pain with vibratory sensation. There is no pain along the course or insertion of the achilles tendon. No other areas of tenderness to bilateral lower extremities. MMT 5/5, ROM WNL. No edema, erythema, increase in warmth to bilateral lower extremities.  No open lesions or pre-ulcerative lesions.  No pain with calf compression, swelling, warmth, erythema  Assessment:  44 year old female plantar fascial partial thickness tear on the medial band and full-thickness tear involving central slip.  Plan: -All treatment options discussed with the patient including all alternatives, risks, complications.  - MRI studies were discussed with the patient. - at this time she has had ongoing symptoms for 6 months without any resolution despite conservative treatment. I discussed with her surgical intervention  Involving to  completely release the medial band of the plantar fascia as it was a partial-thickness tear. She also like to proceed with heel spur resection. -The incision placement as well as the postoperative course was discussed with the patient. I discussed risks of the surgery which include, but not limited to, infection, bleeding, pain, swelling, need for further surgery, delayed or nonhealing, painful or ugly scar, numbness or sensation changes, over/under correction, recurrence, transfer lesions, further deformity, hardware failure, DVT/PE, loss of toe/foot. Patient understands these risks and wishes to proceed with surgery. The surgical consent was reviewed with the patient all 3 pages were signed. No promises or guarantees were given to the outcome of the procedure. All questions were answered to the best of my ability. Before the surgery the patient was encouraged to call the office if there is any further questions. [facility] basis.  Ovid CurdMatthew Wagoner, DPM

## 2015-07-26 DIAGNOSIS — M7731 Calcaneal spur, right foot: Secondary | ICD-10-CM | POA: Diagnosis not present

## 2015-07-26 DIAGNOSIS — M722 Plantar fascial fibromatosis: Secondary | ICD-10-CM | POA: Diagnosis not present

## 2015-07-27 ENCOUNTER — Telehealth: Payer: Self-pay | Admitting: *Deleted

## 2015-07-27 NOTE — Telephone Encounter (Signed)
CALLED PATIENT TO SEE HOW SHE WAS DOING AFTER SURGERY AND PATIENT STATED THAT THE BLOCK WAS WEARING OFF BUT WAS DOING GOOD AND THAT WE WOULD SEE HER ON 08-01-15 AT 2:15 PM. Dshawn Mcnay

## 2015-08-01 ENCOUNTER — Other Ambulatory Visit: Payer: Self-pay

## 2015-08-01 DIAGNOSIS — M722 Plantar fascial fibromatosis: Secondary | ICD-10-CM

## 2015-08-04 ENCOUNTER — Ambulatory Visit (INDEPENDENT_AMBULATORY_CARE_PROVIDER_SITE_OTHER): Payer: Managed Care, Other (non HMO)

## 2015-08-04 ENCOUNTER — Ambulatory Visit (INDEPENDENT_AMBULATORY_CARE_PROVIDER_SITE_OTHER): Payer: Managed Care, Other (non HMO) | Admitting: Podiatry

## 2015-08-04 VITALS — Temp 98.5°F

## 2015-08-04 DIAGNOSIS — M722 Plantar fascial fibromatosis: Secondary | ICD-10-CM

## 2015-08-04 DIAGNOSIS — Z09 Encounter for follow-up examination after completed treatment for conditions other than malignant neoplasm: Secondary | ICD-10-CM

## 2015-08-07 DIAGNOSIS — M722 Plantar fascial fibromatosis: Secondary | ICD-10-CM | POA: Insufficient documentation

## 2015-08-07 NOTE — Progress Notes (Signed)
Patient ID: Sandra PaliMaggie Ashley, female   DOB: 1970/12/04, 45 y.o.   MRN: [id number]  Subjective: Sandra Ashley is a 45 y.o. is seen today in office s/p right EPF and heel spur resection. They state their pain is improving. She is stinging burning pain to the arch of the foot. She has finished antibiotics. She is continuing the CAM boot. Denies any systemic complaints such as fevers, chills, nausea, vomiting. No calf pain, chest pain, shortness of breath.   Objective: General: No acute distress, AAOx3  DP/PT pulses palpable 2/4, CRT < 3 sec to all digits.  Protective sensation intact. Motor function intact.  Right foot: Incision is well coapted without any evidence of dehiscence and sutures intact. There is no surrounding erythema, ascending cellulitis, fluctuance, crepitus, malodor, drainage/purulence. There is mild edema around the surgical site. There is mild pain along the surgical site.  No other areas of tenderness to bilateral lower extremities.  No other open lesions or pre-ulcerative lesions.  No pain with calf compression, swelling, warmth, erythema.   Assessment and Plan:  Status post right foot surgery, doing well with no complications with some post-operative pain  -X-rays were obtained and reviewed with the patient.  -Treatment options discussed including all alternatives, risks, and complications -Ice/elevation -Pain medication as needed. -Continue cam boot at all times even at night. -Monitor for any clinical signs or symptoms of infection and DVT/PE and directed to call the office immediately should any occur or go to the ER. -Follow-up 1 week for possible suture removal or sooner if any problems arise. In the meantime, encouraged to call the office with any questions, concerns, change in symptoms.   Ovid CurdMatthew Garret Teale, DPM

## 2015-08-08 ENCOUNTER — Ambulatory Visit: Payer: Self-pay | Admitting: Podiatry

## 2015-08-11 ENCOUNTER — Ambulatory Visit (INDEPENDENT_AMBULATORY_CARE_PROVIDER_SITE_OTHER): Payer: Managed Care, Other (non HMO) | Admitting: Podiatry

## 2015-08-11 DIAGNOSIS — M722 Plantar fascial fibromatosis: Secondary | ICD-10-CM

## 2015-08-11 DIAGNOSIS — Z09 Encounter for follow-up examination after completed treatment for conditions other than malignant neoplasm: Secondary | ICD-10-CM

## 2015-08-13 NOTE — Progress Notes (Signed)
Patient ID: Sandra Ashley, female   DOB: 1971/01/20, 45 y.o.   MRN: [id number]  Subjective: Sandra Ashley is a 45 y.o. is seen today in office s/p right EPF and heel spur resection. They state their pain is improving and she is doing better. She continues to get some occasional stinging pain to the heel but this is in the morning only and is improving. She is continuing the CAM boot. Denies any systemic complaints such as fevers, chills, nausea, vomiting. No calf pain, chest pain, shortness of breath.   Objective: General: No acute distress, AAOx3  DP/PT pulses palpable 2/4, CRT < 3 sec to all digits.  Protective sensation intact. Motor function intact.  Right foot: Incision is well coapted without any evidence of dehiscence and sutures intact. There is no surrounding erythema, ascending cellulitis, fluctuance, crepitus, malodor, drainage/purulence. There is mild edema around the surgical site. There is mild pain along the surgical site although it does appear decreased.  No other areas of tenderness to bilateral lower extremities.  No other open lesions or pre-ulcerative lesions.  No pain with calf compression, swelling, warmth, erythema.   Assessment and Plan:  Status post right foot surgery, doing well with no complications with some post-operative pain  -X-rays were obtained and reviewed with the patient.  -Sutures removed. Abx ointment and bandage applied. Can start to shower tomorrow.  -Treatment options discussed including all alternatives, risks, and complications -Ice/elevation -Pain medication as needed. -Continue cam boot at all times even at night until further evaluation -continue in CAM bot for now. As her pain subsides can start to transition to a regular sneaker as tolerated. No high impact activities or exercising. Will hold off on returning to work until further evaluation.  -Monitor for any clinical signs or symptoms of infection and DVT/PE and directed to call the office  immediately should any occur or go to the ER. -Follow-up 4 weeks or sooner if any problems arise. In the meantime, encouraged to call the office with any questions, concerns, change in symptoms.   Ovid CurdMatthew Wagoner, DPM

## 2015-08-15 ENCOUNTER — Encounter: Payer: Self-pay | Admitting: Podiatry

## 2015-09-08 ENCOUNTER — Ambulatory Visit (INDEPENDENT_AMBULATORY_CARE_PROVIDER_SITE_OTHER): Payer: Managed Care, Other (non HMO) | Admitting: Podiatry

## 2015-09-08 ENCOUNTER — Encounter: Payer: Self-pay | Admitting: Podiatry

## 2015-09-08 ENCOUNTER — Ambulatory Visit (INDEPENDENT_AMBULATORY_CARE_PROVIDER_SITE_OTHER): Payer: Managed Care, Other (non HMO)

## 2015-09-08 VITALS — BP 107/62 | HR 86 | Resp 18

## 2015-09-08 DIAGNOSIS — M722 Plantar fascial fibromatosis: Secondary | ICD-10-CM

## 2015-09-08 DIAGNOSIS — Z09 Encounter for follow-up examination after completed treatment for conditions other than malignant neoplasm: Secondary | ICD-10-CM

## 2015-09-09 NOTE — Progress Notes (Signed)
Patient ID: Sandra Ashley, female   DOB: 07-Aug-1970, 45 y.o.   MRN: [id number]  Subjective: Sandra Ashley is a 45 y.o. is seen today in office s/p right EPF and heel spur resection. She states that her heel pain has improved although she does get some pain going up and down steps. Because of that she does not feel that she has residual at work. She hasn't wearing a regular shoe over the last week. She does get some stiffness in the morning but this does resolve. Denies any systemic complaints such as fevers, chills, nausea, vomiting. No calf pain, chest pain, shortness of breath.   Objective: General: No acute distress, AAOx3  DP/PT pulses palpable 2/4, CRT < 3 sec to all digits.  Protective sensation intact. Motor function intact.  Right foot: Incision is well coapted without any evidence of dehiscence and a scar has formed. There is no surrounding erythema, ascending cellulitis, fluctuance, crepitus, malodor, drainage/purulence. There is decreased edema around the surgical site. There is decreased but continued mild pain along the surgical site. No significant discomfort to the plantar aspect of the heel on the plantar fascia. No other areas of tenderness to bilateral lower extremities.  No other open lesions or pre-ulcerative lesions.  No pain with calf compression, swelling, warmth, erythema.   Assessment and Plan:  Status post right foot surgery, improving   -Treatment options discussed including all alternatives, risks, and complications -Continue cocoa butter or vitamin E cream over the incision daily. -Ice/elevation -Pain medication as needed. -Continue night splint or cam boot at night. -Recommend physical therapy. This was prescribed today.  -As she is not ready yet to go back to work going up and down ladders will start physical therapy [facility] her back in 3 weeks for further evaluation before returning to work. -Monitor for any clinical signs or symptoms of infection and DVT/PE and  directed to call the office immediately should any occur or go to the ER. -Follow-up 2 weeks or sooner if any problems arise. In the meantime, encouraged to call the office with any questions, concerns, change in symptoms.   Sandra Ashley, DPM

## 2015-09-12 ENCOUNTER — Encounter: Payer: Self-pay | Admitting: Podiatry

## 2015-09-29 ENCOUNTER — Ambulatory Visit (INDEPENDENT_AMBULATORY_CARE_PROVIDER_SITE_OTHER): Payer: Managed Care, Other (non HMO) | Admitting: Podiatry

## 2015-09-29 ENCOUNTER — Encounter: Payer: Self-pay | Admitting: Podiatry

## 2015-09-29 ENCOUNTER — Ambulatory Visit (INDEPENDENT_AMBULATORY_CARE_PROVIDER_SITE_OTHER): Payer: Managed Care, Other (non HMO)

## 2015-09-29 VITALS — BP 137/86 | HR 69 | Resp 12

## 2015-09-29 DIAGNOSIS — Z09 Encounter for follow-up examination after completed treatment for conditions other than malignant neoplasm: Secondary | ICD-10-CM

## 2015-09-29 DIAGNOSIS — M722 Plantar fascial fibromatosis: Secondary | ICD-10-CM | POA: Diagnosis not present

## 2015-09-29 MED ORDER — METHYLPREDNISOLONE 4 MG PO TBPK: ORAL_TABLET | ORAL | Status: DC

## 2015-10-03 NOTE — Progress Notes (Signed)
Patient ID: Sandra Ashley, female   DOB: 06-Oct-1970, 45 y.o.   MRN: [id number]  Subjective: Sandra Ashley is a 45 y.o. is seen today in office s/p right EPF and heel spur resection. She feels that overall she is making improvements. She is to go to physical therapy and she states this has been helping. She has returned to regular shoe. Overall she feels her pain is improving. She has not yet back at work. He should be working with physical therapy this week with ladders. Denies any systemic complaints such as fevers, chills, nausea, vomiting. No calf pain, chest pain, shortness of breath.   Objective: General: No acute distress, AAOx3  DP/PT pulses palpable 2/4, CRT < 3 sec to all digits.  Protective sensation intact. Motor function intact.  Right foot: Incision is well coapted without any evidence of dehiscence and a scar has formed. There is no surrounding erythema, ascending cellulitis, fluctuance, crepitus, malodor, drainage/purulence. There is minimal edema around the surgical site. There is decreased but continued mild pain along the surgical site and to the plantar heel. It does appear to be improved compared to pre-op.  No other areas of tenderness to bilateral lower extremities.  No other open lesions or pre-ulcerative lesions.  No pain with calf compression, swelling, warmth, erythema.   Assessment and Plan:  Status post right foot surgery, improving   -Treatment options discussed including all alternatives, risks, and complications -Continue cocoa butter or vitamin E cream over the incision daily. -Continue physical therapy for now. She would like to go back to work on March 6 the nose provided today. Prescribed Medrol Dosepak to help decrease her pain is well. -Plantar fascial taping applied. -Follow-up as scheduled or sooner if any problems arise. In the meantime, encouraged to call the office with any questions, concerns, change in symptoms.   Ovid Curd, DPM

## 2015-10-23 ENCOUNTER — Encounter: Payer: Self-pay | Admitting: Podiatry

## 2015-10-23 ENCOUNTER — Ambulatory Visit (INDEPENDENT_AMBULATORY_CARE_PROVIDER_SITE_OTHER): Payer: Managed Care, Other (non HMO) | Admitting: Podiatry

## 2015-10-23 VITALS — BP 106/55 | HR 72 | Resp 18

## 2015-10-23 DIAGNOSIS — Z09 Encounter for follow-up examination after completed treatment for conditions other than malignant neoplasm: Secondary | ICD-10-CM

## 2015-10-23 DIAGNOSIS — M722 Plantar fascial fibromatosis: Secondary | ICD-10-CM

## 2015-10-24 ENCOUNTER — Telehealth: Payer: Self-pay | Admitting: *Deleted

## 2015-10-24 MED ORDER — NONFORMULARY OR COMPOUNDED ITEM: Status: DC

## 2015-10-24 NOTE — Telephone Encounter (Signed)
Dr. Wagoner ordered [facility] Antiinflammatory cream.  Faxed. 

## 2015-10-24 NOTE — Progress Notes (Signed)
Patient ID: Sandra Ashley, female   DOB: 06-02-1971, 45 y.o.   MRN: [id number]  Subjective: Sandra Ashley is a 45 y.o. is seen today in office s/p right EPF and heel spur resection. She feels that since last appointment she is doing much better. She has completed physical therapy. She is back to work. She states that she feels better than she did prior to surgery. She gets some occasional tightness for which she points to the lateral ankle and along the Achilles tendon however it is not painful. No recent injury or trauma. No swelling or redness. No tingling or numbness. The pain does not wake her up at night. No other complaints. No calf pain, chest pain, shortness of breath.   Objective: General: No acute distress, AAOx3  DP/PT pulses palpable 2/4, CRT < 3 sec to all digits.  Protective sensation intact. Motor function intact.  Right foot: Incision is well coapted without any evidence of dehiscence and a scar has well formed. There is no surrounding erythema, ascending cellulitis, fluctuance, crepitus, malodor, drainage/purulence. There is minimal no significant edema around the surgical site. There is currently no pain along the surgical site and to the plantar heel. It does appear to be greatly improved compared to pre-op.  Upon palpation of the peroneal tendons there is no pain or discomfort although she feels it is tight at times. This is seen for the distal Achilles tendon. There is no definitive tendon and Thompson test is negative. No other areas of tenderness to bilateral lower extremities.  No other open lesions or pre-ulcerative lesions.  No pain with calf compression, swelling, warmth, erythema.   Assessment and Plan:  Status post right foot surgery, improving   -Treatment options discussed including all alternatives, risks, and complications -Continue cocoa butter or vitamin E cream over the incision daily. -Continue physical therapy exercises daily. Continue supportive shoe gear. She  brought in her old orthotics and she states they make her foot hurt more. She states that she can do better in a regular Brooks tennis shoe and over-the-counter insert. Continue with this. -If the pain/stiffness is not completely resolved in 4 weeks to call the office otherwise follow-up as needed.  Ovid CurdMatthew Kerin Kren, DPM

## 2017-10-07 ENCOUNTER — Other Ambulatory Visit: Payer: Self-pay | Admitting: Gastroenterology

## 2017-10-07 DIAGNOSIS — R109 Unspecified abdominal pain: Secondary | ICD-10-CM

## 2017-10-13 ENCOUNTER — Ambulatory Visit
Admission: RE | Admit: 2017-10-13 | Discharge: 2017-10-13 | Disposition: A | Discharge status: Home / Self Care | Payer: Managed Care, Other (non HMO) | Source: Ambulatory Visit | Attending: Gastroenterology | Admitting: Gastroenterology

## 2017-10-13 DIAGNOSIS — R109 Unspecified abdominal pain: Secondary | ICD-10-CM

## 2017-10-13 MED ORDER — IOPAMIDOL (ISOVUE-300) INJECTION 61%
125.0000 mL | Freq: Once | INTRAVENOUS | Status: AC | PRN
  Administered 2017-10-13: 125 mL via INTRAVENOUS

## 2018-06-29 DIAGNOSIS — Z8719 Personal history of other diseases of the digestive system: Secondary | ICD-10-CM | POA: Insufficient documentation

## 2018-06-29 DIAGNOSIS — A6000 Herpesviral infection of urogenital system, unspecified: Secondary | ICD-10-CM | POA: Insufficient documentation

## 2018-06-29 DIAGNOSIS — K219 Gastro-esophageal reflux disease without esophagitis: Secondary | ICD-10-CM | POA: Diagnosis present

## 2019-06-23 ENCOUNTER — Other Ambulatory Visit: Payer: Self-pay | Admitting: Gastroenterology

## 2019-06-23 DIAGNOSIS — R1032 Left lower quadrant pain: Secondary | ICD-10-CM

## 2019-07-06 ENCOUNTER — Ambulatory Visit
Admission: RE | Admit: 2019-07-06 | Discharge: 2019-07-06 | Disposition: A | Discharge status: Home / Self Care | Payer: 59 | Source: Ambulatory Visit | Attending: Gastroenterology | Admitting: Gastroenterology

## 2019-07-06 DIAGNOSIS — R1032 Left lower quadrant pain: Secondary | ICD-10-CM

## 2019-07-21 ENCOUNTER — Emergency Department (HOSPITAL_COMMUNITY): Payer: No Typology Code available for payment source

## 2019-07-21 ENCOUNTER — Encounter (HOSPITAL_COMMUNITY): Payer: Self-pay | Admitting: Emergency Medicine

## 2019-07-21 ENCOUNTER — Other Ambulatory Visit: Payer: Self-pay

## 2019-07-21 ENCOUNTER — Emergency Department (HOSPITAL_COMMUNITY)
Admission: EM | Admit: 2019-07-21 | Discharge: 2019-07-21 | Disposition: A | Discharge status: Home / Self Care | Payer: No Typology Code available for payment source | Attending: Emergency Medicine | Admitting: Emergency Medicine

## 2019-07-21 DIAGNOSIS — N200 Calculus of kidney: Secondary | ICD-10-CM | POA: Diagnosis not present

## 2019-07-21 DIAGNOSIS — R102 Pelvic and perineal pain: Secondary | ICD-10-CM

## 2019-07-21 DIAGNOSIS — N131 Hydronephrosis with ureteral stricture, not elsewhere classified: Secondary | ICD-10-CM | POA: Insufficient documentation

## 2019-07-21 DIAGNOSIS — R1031 Right lower quadrant pain: Secondary | ICD-10-CM | POA: Diagnosis present

## 2019-07-21 DIAGNOSIS — R109 Unspecified abdominal pain: Secondary | ICD-10-CM

## 2019-07-21 LAB — URINALYSIS, ROUTINE W REFLEX MICROSCOPIC
Bacteria, UA: NONE SEEN
RBC / HPF: >50 RBC/hpf — ABNORMAL HIGH (ref 0–5)

## 2019-07-21 LAB — BASIC METABOLIC PANEL
Anion gap: 8 (ref 5–15)
BUN: 11 mg/dL (ref 6–20)
CO2: 26 mmol/L (ref 22–32)
Calcium: 8.9 mg/dL (ref 8.9–10.3)
Chloride: 106 mmol/L (ref 98–111)
Creatinine, Ser: 1.1 mg/dL — ABNORMAL HIGH (ref 0.44–1.00)
GFR calc Af Amer: >60 mL/min (ref >60)
GFR calc non Af Amer: 60 mL/min — ABNORMAL LOW (ref >60)
Glucose, Bld: 129 mg/dL — ABNORMAL HIGH (ref 70–99)
Potassium: 4 mmol/L (ref 3.5–5.1)
Sodium: 140 mmol/L (ref 135–145)

## 2019-07-21 LAB — CBC
HCT: 42.6 % (ref 36.0–46.0)
Hemoglobin: 13.6 g/dL (ref 12.0–15.0)
MCH: 29.3 pg (ref 26.0–34.0)
MCHC: 31.9 g/dL (ref 30.0–36.0)
MCV: 91.8 fL (ref 80.0–100.0)
Platelets: 224 10*3/uL (ref 150–400)
RBC: 4.64 MIL/uL (ref 3.87–5.11)
RDW: 13.5 % (ref 11.5–15.5)
WBC: 7.5 10*3/uL (ref 4.0–10.5)
nRBC: 0 % (ref 0.0–0.2)

## 2019-07-21 LAB — HEPATIC FUNCTION PANEL
ALT: 17 U/L (ref 0–44)
AST: 18 U/L (ref 15–41)
Albumin: 3.6 g/dL (ref 3.5–5.0)
Alkaline Phosphatase: 73 U/L (ref 38–126)
Bilirubin, Direct: <0.1 mg/dL (ref 0.0–0.2)
Total Bilirubin: 0.6 mg/dL (ref 0.3–1.2)
Total Protein: 6.8 g/dL (ref 6.5–8.1)

## 2019-07-21 LAB — LIPASE, BLOOD: Lipase: 18 U/L (ref 11–51)

## 2019-07-21 LAB — I-STAT BETA HCG BLOOD, ED (MC, WL, AP ONLY): I-stat hCG, quantitative: <5 m[IU]/mL (ref <5)

## 2019-07-21 MED ORDER — SODIUM CHLORIDE 0.9 % IV BOLUS
1000.0000 mL | Freq: Once | INTRAVENOUS | Status: AC
  Administered 2019-07-21: 1000 mL via INTRAVENOUS

## 2019-07-21 MED ORDER — TAMSULOSIN HCL 0.4 MG PO CAPS: 0.4000 mg | ORAL_CAPSULE | Freq: Every day | ORAL | 0 refills | Status: AC

## 2019-07-21 MED ORDER — HYDROCODONE-ACETAMINOPHEN 5-325 MG PO TABS: 1.0000 | ORAL_TABLET | Freq: Four times a day (QID) | ORAL | 0 refills | Status: DC | PRN

## 2019-07-21 NOTE — ED Notes (Signed)
Pt dc'd home w/allbelongings, a/o x4, no narcotics given in ED  

## 2019-07-21 NOTE — ED Notes (Signed)
Patient transported to Ultrasound 

## 2019-07-21 NOTE — ED Triage Notes (Signed)
From home, flank pain going to her pelvis X3 hours.  Had some nausea but not now.  Is unable to sit still.  No pain w/ urination or blood in urine.   200/100 79 HR 18RR 99% RA CBG 153

## 2019-07-21 NOTE — Discharge Instructions (Signed)
Your CT scan showed a kidney stone today with some fluid build up into your kidney. Your kidney function (creatinine) was also mildly elevated today likely from the stone itself.   I have prescribed medication to take daily to help you urinate and flush out the stone. Please also increase your daily fluid intake.   I have also prescribed a very short course of pain medication to take AS NEEDED for severe pain.   Follow up with your PCP to have your kidney function rechecked in 1-2 weeks. They should also check your urine once you are off of your period to ensure there is no infection.   Follow up with [facility] as well.

## 2019-07-21 NOTE — ED Provider Notes (Signed)
[facility] EMERGENCY DEPARTMENT Provider Note   CSN: [id number] Arrival date & time: 07/21/19  4098     History Chief Complaint  Patient presents with  . Flank Pain    Sandra Ashley is a 48 y.o. female with PMHx diverticulitis and bilateral ovarian cysts with hx of rupture who presents to the ED today complaining of sudden onset, constant, gradually improving, right flank pain radiating into right inguinal area that began approximately 3 hours PTA.  Reports the pain woke her up out of her sleep.  She reports she did not take anything prior to arrival via EMS but states the pain has now dissipated.  She does endorse she had some nausea with the pain but states this has resolved as well.  Patient is currently on her period.  She reports that she stopped bleeding yesterday but it returned late last night and she is unable to discern if it is also coming from her urine as she has a lot of blood in the toilet bowl as well as with a pad.  She states the pain radiates into her groin but she cannot discern if it is specifically pelvic pain.  She does endorse history of ruptured ovarian cysts and states this somewhat feels similar.  Patient is married and is sexually active with her husband, not concerned about STDs at this time.  She denies fever, chills, vomiting, dysuria, urinary frequency, vaginal discharge, any other associated symptoms.   The history is provided by the patient and medical records.       Past Medical History:  Diagnosis Date  . Bilateral ovarian cysts   . Diverticulitis     Patient Active Problem List   Diagnosis Date Noted  . Plantar fasciitis of right foot 08/07/2015  . Nontraumatic tear of plantar fascia 07/20/2015    History reviewed. No pertinent surgical history.   OB History   No obstetric history on file.     No family history on file.  Social History   Tobacco Use  . Smoking status: Never Smoker  Substance Use Topics  . Alcohol  use: Yes    Comment: social   . Drug use: No    Home Medications Prior to Admission medications   Medication Sig Start Date End Date Taking? Authorizing Provider  ciprofloxacin (CIPRO) 500 MG tablet Take 1 tablet (500 mg total) by mouth 2 (two) times daily. One po bid x 7 days 03/03/15   Ladona Mow, PA-C  diclofenac (VOLTAREN) 75 MG EC tablet Take 1 tablet (75 mg total) by mouth 2 (two) times daily. 06/20/15   Vivi Barrack, DPM  HYDROcodone-acetaminophen (NORCO/VICODIN) 5-325 MG tablet Take 1 tablet by mouth every 6 (six) hours as needed for severe pain. 07/21/19   Tanda Rockers, PA-C  methylPREDNISolone (MEDROL DOSEPAK) 4 MG TBPK tablet Take as directed 09/29/15   Vivi Barrack, DPM  methylPREDNIsolone (MEDROL DOSPACK) 4 MG tablet follow package directions 08/30/14   Hyatt, Max T, DPM  metroNIDAZOLE (FLAGYL) 500 MG tablet Take 1 tablet (500 mg total) by mouth 3 (three) times daily. 03/03/15   Ladona Mow, PA-C  NONFORMULARY OR COMPOUNDED ITEM [facility] compound:  Antiinflammatory cream - Diclofenac 3%, Baclofen 2%, Cyclobenzaprine 2%, Lidocaine 2%, dispense 120 grams, apply 1-2 grams to affected area 3-4 times a day, +2 refills. 10/24/15   Vivi Barrack, DPM  ondansetron (ZOFRAN) 4 MG tablet Take 1 tablet (4 mg total) by mouth every 8 (eight) hours as needed. 03/03/15  Dahlia Bailiff, PA-C  oxyCODONE-acetaminophen (PERCOCET) 5-325 MG per tablet Take 1-2 tablets by mouth every 6 (six) hours as needed. 03/03/15   Dahlia Bailiff, PA-C  tamsulosin (FLOMAX) 0.4 MG CAPS capsule Take 1 capsule (0.4 mg total) by mouth daily for 10 days. 07/21/19 07/31/19  Eustaquio Maize, PA-C    Allergies    Zithromax [azithromycin]  Review of Systems   Review of Systems  Constitutional: Negative for chills and fever.  HENT: Negative for congestion.   Eyes: Negative for visual disturbance.  Respiratory: Negative for cough and shortness of breath.   Cardiovascular: Negative for chest pain.    Gastrointestinal: Positive for abdominal pain and nausea. Negative for constipation, diarrhea and vomiting.  Genitourinary: Positive for flank pain. Negative for menstrual problem.  Musculoskeletal: Negative for myalgias.  Skin: Negative for rash.  Neurological: Negative for headaches.    Physical Exam Updated Vital Signs BP 128/88 (BP Location: Right Arm)   Pulse 70   Temp 98 F (36.7 C) (Oral)   Resp 16   SpO2 98%   Physical Exam Vitals and nursing note reviewed.  Constitutional:      Appearance: She is not ill-appearing or diaphoretic.  HENT:     Head: Normocephalic and atraumatic.  Eyes:     Conjunctiva/sclera: Conjunctivae normal.  Cardiovascular:     Rate and Rhythm: Normal rate and regular rhythm.     Pulses: Normal pulses.  Pulmonary:     Effort: Pulmonary effort is normal.     Breath sounds: Normal breath sounds. No wheezing, rhonchi or rales.  Abdominal:     Palpations: Abdomen is soft.     Tenderness: There is no abdominal tenderness. There is right CVA tenderness. There is no left CVA tenderness, guarding or rebound.     Comments: Soft, mild tenderness to suprapubic area, +BS throughout, no r/g/r, neg murphy's, neg mcburney's, + right CVA TTP  Musculoskeletal:     Cervical back: Neck supple.  Skin:    General: Skin is warm and dry.  Neurological:     Mental Status: She is alert.     ED Results / Procedures / Treatments   Labs (all labs ordered are listed, but only abnormal results are displayed) Labs Reviewed  URINALYSIS, ROUTINE W REFLEX MICROSCOPIC - Abnormal; Notable for the following components:      Result Value   Color, Urine RED (*)    APPearance TURBID (*)    Glucose, UA   (*)    Value: TEST NOT REPORTED DUE TO COLOR INTERFERENCE OF URINE PIGMENT   Hgb urine dipstick   (*)    Value: TEST NOT REPORTED DUE TO COLOR INTERFERENCE OF URINE PIGMENT   Bilirubin Urine   (*)    Value: TEST NOT REPORTED DUE TO COLOR INTERFERENCE OF URINE PIGMENT    Ketones, ur   (*)    Value: TEST NOT REPORTED DUE TO COLOR INTERFERENCE OF URINE PIGMENT   Protein, ur   (*)    Value: TEST NOT REPORTED DUE TO COLOR INTERFERENCE OF URINE PIGMENT   Nitrite   (*)    Value: TEST NOT REPORTED DUE TO COLOR INTERFERENCE OF URINE PIGMENT   Leukocytes,Ua   (*)    Value: TEST NOT REPORTED DUE TO COLOR INTERFERENCE OF URINE PIGMENT   RBC / HPF >50 (*)    All other components within normal limits  BASIC METABOLIC PANEL - Abnormal; Notable for the following components:   Glucose, Bld 129 (*)    Creatinine,  Ser 1.10 (*)    GFR calc non Af Amer 60 (*)    All other components within normal limits  CBC  HEPATIC FUNCTION PANEL  LIPASE, BLOOD  I-STAT BETA HCG BLOOD, ED ([facility], [facility], [facility] ONLY)    EKG None  Radiology CT Renal Stone Study  Result Date: 07/21/2019 CLINICAL DATA:  Right-sided flank pain EXAM: CT ABDOMEN AND PELVIS WITHOUT CONTRAST TECHNIQUE: Multidetector CT imaging of the abdomen and pelvis was performed following the standard protocol without IV contrast. COMPARISON:  07/06/2019 FINDINGS: Lower chest: No acute abnormality. Hepatobiliary: No focal liver abnormality is seen. No gallstones, gallbladder wall thickening, or biliary dilatation. Pancreas: Unremarkable. No pancreatic ductal dilatation or surrounding inflammatory changes. Spleen: A few calcified granulomas are seen. No other focal abnormality is noted. Adrenals/Urinary Tract: Adrenal glands are unremarkable. Kidneys are well visualized bilaterally. Mild right-sided hydronephrosis and hydroureter is seen which extends to the level of the right ureterovesical junction. A small 3 mm stone is noted at the right UVJ causing the obstructive change. The bladder is well distended. Stomach/Bowel: Diverticular change of the colon is noted without definitive diverticulitis. No obstructive changes are seen. The appendix is within normal limits. No small bowel or gastric abnormality is noted. Vascular/Lymphatic:  Aortic atherosclerosis. No enlarged abdominal or pelvic lymph nodes. Reproductive: Uterus and bilateral adnexa are unremarkable. Other: No abdominal wall hernia or abnormality. No abdominopelvic ascites. Musculoskeletal: No acute or significant osseous findings. IMPRESSION: Interval migration of a previously seen right renal stone to the ureterovesical junction with resultant mild hydronephrosis and hydroureter. No other focal abnormality is noted. Electronically Signed   By: Alcide Clever M.D.   On: 07/21/2019 11:45   US PELVIC COMPLETE W TRANSVAGINAL AND TORSION R/O  Result Date: 07/21/2019 CLINICAL DATA:  Right-sided pelvic pain for several hours EXAM: TRANSABDOMINAL AND TRANSVAGINAL ULTRASOUND OF PELVIS DOPPLER ULTRASOUND OF OVARIES TECHNIQUE: Both transabdominal and transvaginal ultrasound examinations of the pelvis were performed. Transabdominal technique was performed for global imaging of the pelvis including uterus, ovaries, adnexal regions, and pelvic cul-de-sac. It was necessary to proceed with endovaginal exam following the transabdominal exam to visualize the ovaries. Color and duplex Doppler ultrasound was utilized to evaluate blood flow to the ovaries. COMPARISON:  CT from 07/06/2019 FINDINGS: Uterus Measurements: 9.1 x 4.1 x 4.8 cm = volume: 93 mL. No fibroids or other mass visualized. Nabothian cysts are noted within the cervix. Endometrium Thickness: 5 mm.  No focal abnormality visualized. Right ovary Measurements: 2.4 x 1.8 x 1.7 cm. = volume: 3.7 mL. Normal appearance/no adnexal mass. Left ovary Measurements: 2.5 x 1.8 x 2.4 cm = volume: 5.7 mL. Normal appearance/no adnexal mass. Pulsed Doppler evaluation of both ovaries demonstrates normal low-resistance arterial and venous waveforms. Other findings No abnormal free fluid. IMPRESSION: No acute abnormality noted.  No evidence of torsion is seen. Electronically Signed   By: Alcide Clever M.D.   On: 07/21/2019 10:16    Procedures Procedures  (including critical care time)  Medications Ordered in ED Medications  sodium chloride 0.9 % bolus 1,000 mL (0 mLs Intravenous Stopped 07/21/19 1055)    ED Course  I have reviewed the triage vital signs and the nursing notes.  Pertinent labs & imaging results that were available during my care of the patient were reviewed by me and considered in my medical decision making (see chart for details).  48 year old female presents the ED today complaining of right flank, right inguinal pain and pelvic pain that began early this morning.  No history of kidney stones.  States she is currently on her period and cannot discern if she is having hematuria.  States pain resolved while in the waiting room today.  Signs stable.  Patient afebrile without tachycardia or tachypnea.  Per triage report patient significantly uncomfortable on exam although during my exam she reports pain has completely resolved.  She does have some mild right CVA tenderness and suprapubic tenderness to palpation.  Patient does have a history of bilateral ovarian cysts that have ruptured in the past and states that this feels similar.  Question whether she is having kidney stone versus ruptured cyst.  Patient is married and occasionally has intercourse with her husband although states she is not concerned about STDs at this time and is declining pelvic exam.  Feel that this is appropriate at this time given we will obtain pelvic ultrasound.   Lab work obtained prior to being seen.  Patient without any leukocytosis.  Globin is stable.  Her creatinine is mildly elevated at 1.10.  No other electrolyte abnormalities today.  Lipase within normal limits.  No increase in LFTs today.  Patient beta-hCG negative.  Urinalysis unfortunately unable to be read appropriately given amount of blood from menses.  1 L normal saline bolus given to elevated creatinine.  Leg ultrasound without any acute findings.  Will proceed with CT renal study.  It does  appear patient had a virtual colonoscopy done the other day with incidental finding of right nephrolithiasis.  Would like to ascertain whether patient is having any hydronephrosis or hydroureter today so we will repeat CT at this time.   CT with findings of right stone with mild hydroureter and hydronephrosis.  Will discharge patient home with short course of pain medication as needed as well as Flomax and follow-up outpatient with [facility].  Patient to have kidney function rechecked by PCP as well in the next 1 to 2 weeks after increasing fluid intake.  Really would like her to have her urine rechecked once she is off of her menstrual cycle.  She is in agreement with plan at this time and stable for discharge home.   MDM Rules/Calculators/A&P                       Final Clinical Impression(s) / ED Diagnoses Final diagnoses:  Right flank pain  Nephrolithiasis    Rx / DC Orders ED Discharge Orders         Ordered    tamsulosin (FLOMAX) 0.4 MG CAPS capsule  Daily     07/21/19 1227    HYDROcodone-acetaminophen (NORCO/VICODIN) 5-325 MG tablet  Every 6 hours PRN     07/21/19 1227           Discharge Instructions     Your CT scan showed a kidney stone today with some fluid build up into your kidney. Your kidney function (creatinine) was also mildly elevated today likely from the stone itself.   I have prescribed medication to take daily to help you urinate and flush out the stone. Please also increase your daily fluid intake.   I have also prescribed a very short course of pain medication to take AS NEEDED for severe pain.   Follow up with your PCP to have your kidney function rechecked in 1-2 weeks. They should also check your urine once you are off of your period to ensure there is no infection.   Follow up with [facility] as well.  Tanda RockersVenter, Kamalei Roeder, PA-C 07/21/19 1229    Little, Ambrose Finlandachel Morgan, MD 07/24/19 1215

## 2019-07-21 NOTE — ED Triage Notes (Signed)
Pt reports right sided flank pain that goes down to her pelvic area X3 hours.  Pt is very uncomfortable.

## 2020-04-21 ENCOUNTER — Other Ambulatory Visit: Payer: Self-pay | Admitting: Internal Medicine

## 2020-04-21 ENCOUNTER — Ambulatory Visit
Admission: RE | Admit: 2020-04-21 | Discharge: 2020-04-21 | Disposition: A | Discharge status: Home / Self Care | Payer: No Typology Code available for payment source | Source: Ambulatory Visit | Attending: Internal Medicine | Admitting: Internal Medicine

## 2020-04-21 DIAGNOSIS — R1032 Left lower quadrant pain: Secondary | ICD-10-CM

## 2020-04-21 MED ORDER — IOPAMIDOL (ISOVUE-300) INJECTION 61%
100.0000 mL | Freq: Once | INTRAVENOUS | Status: AC | PRN
  Administered 2020-04-21: 100 mL via INTRAVENOUS

## 2020-10-01 ENCOUNTER — Emergency Department (HOSPITAL_COMMUNITY)
Admission: EM | Admit: 2020-10-01 | Discharge: 2020-10-01 | Disposition: A | Discharge status: Home / Self Care | Payer: No Typology Code available for payment source | Attending: Emergency Medicine | Admitting: Emergency Medicine

## 2020-10-01 ENCOUNTER — Emergency Department (HOSPITAL_COMMUNITY): Payer: No Typology Code available for payment source

## 2020-10-01 ENCOUNTER — Other Ambulatory Visit: Payer: Self-pay

## 2020-10-01 DIAGNOSIS — M6281 Muscle weakness (generalized): Secondary | ICD-10-CM | POA: Diagnosis not present

## 2020-10-01 DIAGNOSIS — H81399 Other peripheral vertigo, unspecified ear: Secondary | ICD-10-CM | POA: Diagnosis not present

## 2020-10-01 DIAGNOSIS — R61 Generalized hyperhidrosis: Secondary | ICD-10-CM | POA: Diagnosis not present

## 2020-10-01 DIAGNOSIS — Q048 Other specified congenital malformations of brain: Secondary | ICD-10-CM | POA: Insufficient documentation

## 2020-10-01 DIAGNOSIS — R110 Nausea: Secondary | ICD-10-CM | POA: Diagnosis present

## 2020-10-01 DIAGNOSIS — K5792 Diverticulitis of intestine, part unspecified, without perforation or abscess without bleeding: Secondary | ICD-10-CM | POA: Diagnosis not present

## 2020-10-01 DIAGNOSIS — R42 Dizziness and giddiness: Secondary | ICD-10-CM

## 2020-10-01 LAB — CBC WITH DIFFERENTIAL/PLATELET
Abs Immature Granulocytes: 0.04 10*3/uL (ref 0.00–0.07)
Basophils Absolute: 0.1 10*3/uL (ref 0.0–0.1)
Basophils Relative: 1 %
Eosinophils Absolute: 0.1 10*3/uL (ref 0.0–0.5)
Eosinophils Relative: 2 %
HCT: 42.1 % (ref 36.0–46.0)
Hemoglobin: 13.8 g/dL (ref 12.0–15.0)
Immature Granulocytes: 1 %
Lymphocytes Relative: 19 %
Lymphs Abs: 1.4 10*3/uL (ref 0.7–4.0)
MCH: 29.4 pg (ref 26.0–34.0)
MCHC: 32.8 g/dL (ref 30.0–36.0)
MCV: 89.8 fL (ref 80.0–100.0)
Monocytes Absolute: 0.6 10*3/uL (ref 0.1–1.0)
Monocytes Relative: 7 %
Neutro Abs: 5.5 10*3/uL (ref 1.7–7.7)
Neutrophils Relative %: 70 %
Platelets: 217 10*3/uL (ref 150–400)
RBC: 4.69 MIL/uL (ref 3.87–5.11)
RDW: 13.4 % (ref 11.5–15.5)
WBC: 7.6 10*3/uL (ref 4.0–10.5)
nRBC: 0 % (ref 0.0–0.2)

## 2020-10-01 LAB — COMPREHENSIVE METABOLIC PANEL
ALT: 15 U/L (ref 0–44)
AST: 14 U/L — ABNORMAL LOW (ref 15–41)
Albumin: 3.1 g/dL — ABNORMAL LOW (ref 3.5–5.0)
Alkaline Phosphatase: 54 U/L (ref 38–126)
Anion gap: 7 (ref 5–15)
BUN: 10 mg/dL (ref 6–20)
CO2: 26 mmol/L (ref 22–32)
Calcium: 8.7 mg/dL — ABNORMAL LOW (ref 8.9–10.3)
Chloride: 102 mmol/L (ref 98–111)
Creatinine, Ser: 0.87 mg/dL (ref 0.44–1.00)
GFR, Estimated: >60 mL/min (ref >60)
Glucose, Bld: 127 mg/dL — ABNORMAL HIGH (ref 70–99)
Potassium: 4.1 mmol/L (ref 3.5–5.1)
Sodium: 135 mmol/L (ref 135–145)
Total Bilirubin: 0.6 mg/dL (ref 0.3–1.2)
Total Protein: 6.5 g/dL (ref 6.5–8.1)

## 2020-10-01 LAB — URINALYSIS, ROUTINE W REFLEX MICROSCOPIC
Bilirubin Urine: NEGATIVE
Glucose, UA: NEGATIVE mg/dL
Hgb urine dipstick: NEGATIVE
Ketones, ur: NEGATIVE mg/dL
Leukocytes,Ua: NEGATIVE
Nitrite: NEGATIVE
Protein, ur: NEGATIVE mg/dL
Specific Gravity, Urine: 1.008 (ref 1.005–1.030)
pH: 7 (ref 5.0–8.0)

## 2020-10-01 MED ORDER — MECLIZINE HCL 25 MG PO TABS
50.0000 mg | ORAL_TABLET | Freq: Once | ORAL | Status: AC
  Administered 2020-10-01: 50 mg via ORAL
  Filled 2020-10-01: qty 2

## 2020-10-01 MED ORDER — ONDANSETRON HCL 4 MG/2ML IJ SOLN
4.0000 mg | Freq: Once | INTRAMUSCULAR | Status: AC
  Administered 2020-10-01: 4 mg via INTRAVENOUS
  Filled 2020-10-01: qty 2

## 2020-10-01 MED ORDER — SODIUM CHLORIDE 0.9 % IV BOLUS
1000.0000 mL | Freq: Once | INTRAVENOUS | Status: AC
  Administered 2020-10-01: 1000 mL via INTRAVENOUS

## 2020-10-01 MED ORDER — MECLIZINE HCL 50 MG PO TABS: 50.0000 mg | ORAL_TABLET | Freq: Three times a day (TID) | ORAL | 0 refills | Status: DC | PRN

## 2020-10-01 MED ORDER — AMOXICILLIN-POT CLAVULANATE 875-125 MG PO TABS: 1.0000 | ORAL_TABLET | Freq: Two times a day (BID) | ORAL | 0 refills | Status: AC

## 2020-10-01 MED ORDER — ONDANSETRON 4 MG PO TBDP: 4.0000 mg | ORAL_TABLET | Freq: Three times a day (TID) | ORAL | 0 refills | Status: DC | PRN

## 2020-10-01 MED ORDER — IOHEXOL 300 MG/ML  SOLN
100.0000 mL | Freq: Once | INTRAMUSCULAR | Status: AC | PRN
  Administered 2020-10-01: 100 mL via INTRAVENOUS

## 2020-10-01 NOTE — ED Notes (Signed)
Pt back from CT

## 2020-10-01 NOTE — ED Triage Notes (Signed)
Pt came in POV & reports Tuesday night her diverticulitis flared up & her GI Dr prescribed Amoxicillin and Tramadol. She reports extreme dizziness and nausea since then.

## 2020-10-01 NOTE — ED Provider Notes (Signed)
[facility] EMERGENCY DEPARTMENT Provider Note   CSN: [id number] Arrival date & time: 10/01/20  0935     History Chief Complaint  Patient presents with  . Dizziness  . Nausea  . Diverticulitis flare up    Sandra Ashley is a 50 y.o. female.  HPI     50 year old female with history of diverticulosis/itis, mixed hyperlipidemia, hypertension, depression, migraine headaches, presents with concern for nausea, dizziness in the setting of starting amoxicillin and tramadol for suspected diverticulitis on Tuesday.   Had typical diverticulitis symptoms she has had many times, began antibiotics for clinical diagnosis and feels the pain is improving. Did have some nausea and dizziness initially but not sure if it was due to severe pain. Her dizziness and nausea has since significantly worsened. It has been present for 2 days.  Describes the dizziness as a room spinning dizziness that comes and goes, and worsens with movement, standing up, head movements.  Reports that if she lays down still, the dizziness resolves.  Has not noted any numbness, weakness, facial droop, visual changes, difficulty speaking.  Reports difficulty walking when she is having the dizziness.  Has had nausea but no vomiting.  Reports that she initially had constipation with her diverticulitis flare, but once she started antibiotics that improved and she is now having normal bowel movements.  Denies black or bloody stools.  Denies chest pain or shortness of breath.  She is only had 3 doses of tramadol and does not feel that the symptoms of dizziness are brought on by the timing of this medication.  She has never taken tramadol before.   Past Medical History:  Diagnosis Date  . Bilateral ovarian cysts   . Diverticulitis     Patient Active Problem List   Diagnosis Date Noted  . Plantar fasciitis of right foot 08/07/2015  . Nontraumatic tear of plantar fascia 07/20/2015    No past surgical history on  file.   OB History   No obstetric history on file.     No family history on file.  Social History   Tobacco Use  . Smoking status: Never Smoker  Substance Use Topics  . Alcohol use: Yes    Comment: social   . Drug use: No    Home Medications Prior to Admission medications   Medication Sig Start Date End Date Taking? Authorizing Provider  amoxicillin-clavulanate (AUGMENTIN) 875-125 MG tablet Take 1 tablet by mouth every 12 (twelve) hours for 4 days. 10/01/20 10/05/20 Yes Alvira Monday, MD  meclizine (ANTIVERT) 50 MG tablet Take 1 tablet (50 mg total) by mouth 3 (three) times daily as needed for dizziness. 10/01/20  Yes Alvira Monday, MD  ondansetron (ZOFRAN ODT) 4 MG disintegrating tablet Take 1 tablet (4 mg total) by mouth every 8 (eight) hours as needed for nausea or vomiting. 10/01/20  Yes Alvira Monday, MD  ciprofloxacin (CIPRO) 500 MG tablet Take 1 tablet (500 mg total) by mouth 2 (two) times daily. One po bid x 7 days 03/03/15   Ladona Mow, PA-C  diclofenac (VOLTAREN) 75 MG EC tablet Take 1 tablet (75 mg total) by mouth 2 (two) times daily. 06/20/15   Vivi Barrack, DPM  HYDROcodone-acetaminophen (NORCO/VICODIN) 5-325 MG tablet Take 1 tablet by mouth every 6 (six) hours as needed for severe pain. 07/21/19   Tanda Rockers, PA-C  methylPREDNISolone (MEDROL DOSEPAK) 4 MG TBPK tablet Take as directed 09/29/15   Vivi Barrack, DPM  methylPREDNIsolone (MEDROL DOSPACK) 4 MG tablet  follow package directions 08/30/14   Hyatt, Max T, DPM  metroNIDAZOLE (FLAGYL) 500 MG tablet Take 1 tablet (500 mg total) by mouth 3 (three) times daily. 03/03/15   Ladona MowMintz, Joe, PA-C  NONFORMULARY OR COMPOUNDED ITEM [facility] compound:  Antiinflammatory cream - Diclofenac 3%, Baclofen 2%, Cyclobenzaprine 2%, Lidocaine 2%, dispense 120 grams, apply 1-2 grams to affected area 3-4 times a day, +2 refills. 10/24/15   Vivi BarrackWagoner, Matthew R, DPM  ondansetron (ZOFRAN) 4 MG tablet Take 1 tablet (4 mg  total) by mouth every 8 (eight) hours as needed. 03/03/15   Ladona MowMintz, Joe, PA-C  oxyCODONE-acetaminophen (PERCOCET) 5-325 MG per tablet Take 1-2 tablets by mouth every 6 (six) hours as needed. 03/03/15   Ladona MowMintz, Joe, PA-C    Allergies    Zithromax [azithromycin]  Review of Systems   Review of Systems  Constitutional: Positive for diaphoresis. Negative for fever.  HENT: Negative for sore throat.   Eyes: Negative for visual disturbance.  Respiratory: Negative for cough and shortness of breath.   Cardiovascular: Negative for chest pain.  Gastrointestinal: Positive for abdominal pain and nausea. Negative for constipation, diarrhea and vomiting.  Genitourinary: Negative for difficulty urinating and dysuria.  Musculoskeletal: Negative for back pain and neck pain.  Skin: Negative for rash.  Neurological: Positive for dizziness. Negative for syncope, facial asymmetry, speech difficulty, weakness, light-headedness, numbness and headaches.    Physical Exam Updated Vital Signs BP 112/72 (BP Location: Right Arm)   Pulse 68   Temp 98.7 F (37.1 C) (Oral)   Resp 17   SpO2 99%   Physical Exam Vitals and nursing note reviewed.  Constitutional:      General: She is not in acute distress.    Appearance: She is well-developed and well-nourished. She is diaphoretic.  HENT:     Head: Normocephalic and atraumatic.     Comments: Inconclusive head impulse testing//no nystagmus Eyes:     General: No visual field deficit.    Extraocular Movements: EOM normal.     Conjunctiva/sclera: Conjunctivae normal.  Cardiovascular:     Rate and Rhythm: Normal rate and regular rhythm.     Pulses: Intact distal pulses.     Heart sounds: Normal heart sounds. No murmur heard. No friction rub. No gallop.   Pulmonary:     Effort: Pulmonary effort is normal. No respiratory distress.     Breath sounds: Normal breath sounds. No wheezing or rales.  Abdominal:     General: There is no distension.     Palpations:  Abdomen is soft.     Tenderness: There is abdominal tenderness (LLQ). There is guarding.  Musculoskeletal:        General: No tenderness or edema.     Cervical back: Normal range of motion.  Skin:    General: Skin is warm.     Findings: No erythema or rash.  Neurological:     Mental Status: She is alert and oriented to person, place, and time.     GCS: GCS eye subscore is 4. GCS verbal subscore is 5. GCS motor subscore is 6.     Cranial Nerves: Cranial nerves are intact. No cranial nerve deficit, dysarthria or facial asymmetry (question mild left sided, husband reports looks baseline).     Sensory: Sensation is intact. No sensory deficit.     Motor: Motor function is intact. No pronator drift. Weakness: mild LLE weakness in comparison to right.     Coordination: Coordination is intact. Coordination normal. Finger-Nose-Finger Test normal.  ED Results / Procedures / Treatments   Labs (all labs ordered are listed, but only abnormal results are displayed) Labs Reviewed  COMPREHENSIVE METABOLIC PANEL - Abnormal; Notable for the following components:      Result Value   Glucose, Bld 127 (*)    Calcium 8.7 (*)    Albumin 3.1 (*)    AST 14 (*)    All other components within normal limits  CBC WITH DIFFERENTIAL/PLATELET  URINALYSIS, ROUTINE W REFLEX MICROSCOPIC  POC URINE PREG, ED    EKG EKG Interpretation  Date/Time:  Sunday October 01 2020 10:23:37 EST Ventricular Rate:  63 PR Interval:    QRS Duration: 107 QT Interval:  397 QTC Calculation: 407 R Axis:   23 Text Interpretation: Sinus rhythm Low voltage, precordial leads Borderline T abnormalities, anterior leads No previous ECGs available Confirmed by Alvira Monday ([id number]) on 10/01/2020 10:29:45 AM Also confirmed by Alvira Monday ([id number]), editor Elita Quick ([id number])  on 10/01/2020 2:31:21 PM   Radiology MR BRAIN WO CONTRAST  Result Date: 10/01/2020 CLINICAL DATA:  50 year old female with dizziness and nausea  since beginning Amoxicillin and Tramadol last week. EXAM: MRI HEAD WITHOUT CONTRAST TECHNIQUE: Multiplanar, multiecho pulse sequences of the brain and surrounding structures were obtained without intravenous contrast. COMPARISON:  None. 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 But there is no associated signal abnormality at the cervicomedullary junction. The other basilar cisterns appear normal, and the visible cervical spinal cord appears normal. No restricted diffusion to suggest acute infarction. No midline shift, mass effect, evidence of mass lesion, ventriculomegaly, extra-axial collection or acute intracranial hemorrhage. Pituitary appears normal. Normal suprasellar cistern. Wallace Cullens and white matter signal is within normal limits throughout the brain. No encephalomalacia or chronic cerebral blood products. No evidence of dural thickening on this noncontrast exam. Vascular: Major intracranial vascular flow voids are preserved. Skull and upper cervical spine: Aside from cerebellar tonsillar ectopia the visible cervical spine appears normal. Visualized bone marrow signal is within normal limits. Sinuses/Orbits: Negative orbits. Mild to moderate bilateral maxillary sinus mucosal thickening with some superimposed fluid/bubbly opacity. But the remaining paranasal sinuses are well aerated; minor mucosal thickening at the right frontoethmoidal recess. Other: Mastoids are clear. Visible internal auditory structures appear normal. Normal stylomastoid foramina. Visible scalp and face soft tissues appear negative. IMPRESSION: 1. Cerebellar tonsillar ectopia, with a pegged appearance of the tonsils. But no associated signal abnormality in the cerebellum or brainstem. And the other basilar cisterns and the visible cervical spine appear normal. Favor Chiari 1 malformation, with no related findings of idiopathic intracranial hypo- or hypertension  (pseudotumor cerebri). 2. Otherwise normal noncontrast MRI appearance of the brain. Negative for acute or chronic ischemia. 3. Mild to moderate bilateral maxillary sinus inflammation. Electronically Signed   By: Odessa Fleming M.D.   On: 10/01/2020 11:14   CT ABDOMEN PELVIS W CONTRAST  Result Date: 10/01/2020 CLINICAL DATA:  Concern for diverticulitis. EXAM: CT ABDOMEN AND PELVIS WITH CONTRAST TECHNIQUE: Multidetector CT imaging of the abdomen and pelvis was performed using the standard protocol following bolus administration of intravenous contrast. CONTRAST:  OMNIPAQUE IOHEXOL 300 MG/ML  SOLN COMPARISON:  April 21, 2020 FINDINGS: Lower chest: No acute abnormality. Hepatobiliary: No focal liver abnormality is seen. No gallstones, gallbladder wall thickening, or biliary dilatation. Pancreas: Unremarkable. No pancreatic ductal dilatation or surrounding inflammatory changes. Spleen: Normal in size without focal abnormality.  Splenule. Adrenals/Urinary Tract: Adrenal glands are unremarkable. Kidneys enhance  symmetrically. No hydronephrosis. No nephrolithiasis. No suspicious renal lesion. Bladder is unremarkable. Stomach/Bowel: There is extensive diverticulosis. There is bowel wall thickening of the descending colon near the confluence with the sigmoid colon. Adjacent to this region, there is fat stranding and trace fluid adjacent to multiple diverticuli. No focal drainable fluid collection. No free air. No evidence of bowel obstruction. Vascular/Lymphatic: Mild atherosclerotic calcifications. No suspicious lymphadenopathy. Reproductive: Uterus and bilateral adnexa are unremarkable. Crenulated LEFT ovarian cyst most consistent with a corpus luteum. Other: Trace free fluid along the posterior RIGHT liver. Musculoskeletal: No acute or significant osseous findings. IMPRESSION: 1. Acute uncomplicated diverticulitis of the descending colon near the confluence with the sigmoid colon. No focal drainable fluid  collection. No free air. Recommend correlation with colon cancer screening history. Aortic Atherosclerosis (ICD10-I70.0). Electronically Signed   By: Meda Klinefelter MD   On: 10/01/2020 13:05    Procedures Procedures   Medications Ordered in ED Medications  sodium chloride 0.9 % bolus 1,000 mL (0 mLs Intravenous Stopped 10/01/20 1246)  ondansetron (ZOFRAN) injection 4 mg (4 mg Intravenous Given 10/01/20 1029)  meclizine (ANTIVERT) tablet 50 mg (50 mg Oral Given 10/01/20 1031)  iohexol (OMNIPAQUE) 300 MG/ML solution 100 mL (100 mLs Intravenous Contrast Given 10/01/20 1255)    ED Course  I have reviewed the triage vital signs and the nursing notes.  Pertinent labs & imaging results that were available during my care of the patient were reviewed by me and considered in my medical decision making (see chart for details).    MDM Rules/Calculators/A&P                          50 year old female with history of diverticulosis/itis, mixed hyperlipidemia, hypertension, depression, migraine headaches, presents with concern for nausea, dizziness in the setting of starting amoxicillin and tramadol for suspected diverticulitis on Tuesday.    Reports improving abdominal pain but given overall clinical worsening and continuing severe LLQ tenderness on exam, CT abdomen pelvis was ordered to evaluate for complications of diverticulitis.   Differential diagnosis for dizziness includes central causes such as stroke, intracranial bleed, mass and peripheral causes such as BPPV, meniere's disease, viral.  Denies other neurologic symptoms and by history sounds peripheral however has mild LLE weakness on exam and given this with dizziness will perform MR brain to further evaluate for central cause.   Labs reviewed without significant abnormalities.  EKG without signs of arrhythmia.   CT abdomen pelvis shows diverticulitis without complications.   MR Brain shows cerebellar tonsillar ectopia favor chiari 1  malformation without findings of CVA or signal abnormality in cerebellum or brainstem. Discussed this is a likely an incidental finding and recommend follow up with PCP.    Dizziness likely secondary to medications, possible other etiology of peripheral vertigo.  LLE exam findings very mild and may be secondary to pain from diverticulitis. Had back pain earlier that resovled, no continuing back pain, no neck pain to suggest emergent pathology in these locations as a cause of symptoms.    Given rx for augmentin to extend course for diverticulitis, given meclizine and zofran for dizziness and nausea.  Has been able to walk to the bathroom and feels improved.  Patient discharged in stable condition with understanding of reasons to return.    Final Clinical Impression(s) / ED Diagnoses Final diagnoses:  Dizziness  Cerebellar tonsillar ectopia (HCC)  Diverticulitis  Peripheral vertigo, unspecified laterality    Rx / DC Orders  ED Discharge Orders         Ordered    amoxicillin-clavulanate (AUGMENTIN) 875-125 MG tablet  Every 12 hours        10/01/20 1330    ondansetron (ZOFRAN ODT) 4 MG disintegrating tablet  Every 8 hours PRN        10/01/20 1330    meclizine (ANTIVERT) 50 MG tablet  3 times daily PRN        10/01/20 1330           Alvira Monday, MD 10/01/20 1607

## 2020-10-01 NOTE — Discharge Instructions (Signed)
Continue your antibiotics as prescribed for your diverticulitis.

## 2020-12-06 ENCOUNTER — Ambulatory Visit: Payer: Self-pay | Admitting: Surgery

## 2020-12-06 NOTE — H&P (Signed)
Sandra Ashley Appointment: 12-07-20 9:30 AM Location: [facility] Patient #: [id number] DOB: 08-19-1970 Married / Language: [place] / Race: White Female  History of Present Illness Sandra Sportsman MD; Dec 07, 2020 10:10 AM) The patient is a 50 year old female who presents with diverticulitis. Note for "Diverticulitis": ` ` ` Patient sent for surgical consultation at the request of Dr Levora Angel  Chief Complaint: Recurrent diverticulitis ` ` The patient is a pleasant obese nonsmoking nondiabetic female who struggled with episodes of recurrent diverticulitis. She believes she had her first diagnosis in 2010. Usually she gets a course of antibiotics and I get that under control. She's pre-certain she's had at least a dozen episodes. She's had more intense attacks the past 6 months. 3 episodes. Hard to get off the antibiotics. More intense pain. She will get low back pain and get somewhat constipated/obstipated. Not to the point of bleeding though. She notes that diverticulitis runs in her family especially on her mother's side. She is followed closely by [facility] gastroenterology. She's had prior colonoscopies. Nodes of any mass or tumor or polyps. No definite stricture yet. She had a right ovarian cyst removed laparoscopically in 1996. No other abdominal surgeries. She is not diabetic. She does not smoke. When she is not having attacks, she tends to move her bowels about twice a day. She is taking Benefiber and avoiding heavy meat or fast food. She still struggles with attacks. Frustrated. She's has to gone to the ER many times.  She can walk well. Usually does a 4 mile walk every day. No personal nor family history of GI/colon cancer, inflammatory bowel disease, irritable bowel syndrome, allergy such as Celiac Sprue, dietary/dairy problems, colitis, ulcers nor gastritis. No recent sick contacts/gastroenteritis. No travel outside the country. No changes in diet. No  dysphagia to solids or liquids. No significant heartburn or reflux. No melena, hematemesis, coffee ground emesis. No evidence of prior gastric/peptic ulceration.  (Review of systems as stated in this history (HPI) or in the review of systems. Otherwise all other 12 point ROS are negative) ` ` ###########################################`  This patient encounter took 35 minutes today to perform the following: obtain history, perform exam, review outside records, interpret tests & imaging, counsel the patient on their diagnosis; and, document this encounter, including findings & plan in the electronic health record (EHR).   Past Surgical History Blanchie Dessert, [place]; Dec 07, 2020 9:35 AM) Colon Polyp Removal - Colonoscopy Foot Surgery Right.  Diagnostic Studies History Blanchie Dessert, [place]; Dec 07, 2020 9:35 AM) Colonoscopy within last year Mammogram 1-3 years ago Pap Smear 1-5 years ago  Allergies Blanchie Dessert, CMA; 2020-12-07 9:36 AM) Allergies Reconciled  Medication History Blanchie Dessert, CMA; 12/07/20 9:38 AM) traMADol HCl (50MG  Tablet, Oral) Active. Albuterol Sulfate HFA (108 (90 Base)MCG/ACT Aerosol Soln, Inhalation) Active. Camila (0.35MG  Tablet, Oral) Active. hydroCHLOROthiazide (12.5MG  Tablet, Oral) Active. Ondansetron HCl (4MG  Tablet, Oral) Active. Simvastatin (10MG  Tablet, Oral) Active. valACYclovir HCl (1GM Tablet, Oral) Active.  Social History , ; Dec 07, 2020 9:35 AM) Alcohol use Remotely quit alcohol use. Caffeine use Coffee, Tea. No drug use Tobacco use Former smoker.  Family History Blanchie Dessert, [place]; 07-Dec-2020 9:35 AM) Alcohol Abuse Brother, Sister. Arthritis Mother. Cancer Sister. Diabetes Mellitus Mother. Heart Disease Father, Mother. Heart disease in female family member before age 77 Hypertension Mother. Ischemic Bowel Disease Brother, Mother, Sister. Kidney Disease Mother. Respiratory Condition Mother. Thyroid problems  Mother.  Pregnancy / Birth History [place], 02/05/[birth year]21; 12/07/2020 9:35 AM) Age at menarche 13 years. Contraceptive History Oral  contraceptives. Gravida 1 Maternal age 76-25 Para 1 Regular periods  Other Problems Blanchie Dessert, [place]; 12/06/2020 9:35 AM) Asthma Diverticulosis Gastroesophageal Reflux Disease Hemorrhoids High blood pressure Kidney Stone Other disease, cancer, significant illness     Review of Systems Blanchie Dessert CMA; 12/06/2020 9:35 AM) General Not Present- Appetite Loss, Chills, Fatigue, Fever, Night Sweats, Weight Gain and Weight Loss. Skin Not Present- Change in Wart/Mole, Dryness, Hives, Jaundice, New Lesions, Non-Healing Wounds, Rash and Ulcer. HEENT Present- Seasonal Allergies and Wears glasses/contact lenses. Not Present- Earache, Hearing Loss, Hoarseness, Nose Bleed, Oral Ulcers, Ringing in the Ears, Sinus Pain, Sore Throat, Visual Disturbances and Yellow Eyes. Respiratory Present- Difficulty Breathing. Not Present- Bloody sputum, Chronic Cough, Snoring and Wheezing. Breast Not Present- Breast Mass, Breast Pain, Nipple Discharge and Skin Changes. Cardiovascular Not Present- Chest Pain, Difficulty Breathing Lying Down, Leg Cramps, Palpitations, Rapid Heart Rate, Shortness of Breath and Swelling of Extremities. Gastrointestinal Not Present- Abdominal Pain, Bloating, Bloody Stool, Change in Bowel Habits, Chronic diarrhea, Constipation, Difficulty Swallowing, Excessive gas, Gets full quickly at meals, Hemorrhoids, Indigestion, Nausea, Rectal Pain and Vomiting. Female Genitourinary Not Present- Frequency, Nocturia, Painful Urination, Pelvic Pain and Urgency. Musculoskeletal Not Present- Back Pain, Joint Pain, Joint Stiffness, Muscle Pain, Muscle Weakness and Swelling of Extremities. Neurological Not Present- Decreased Memory, Fainting, Headaches, Numbness, Seizures, Tingling, Tremor, Trouble walking and Weakness. Psychiatric Present- Depression. Not Present- Anxiety,  Bipolar, Change in Sleep Pattern, Fearful and Frequent crying. Endocrine Not Present- Cold Intolerance, Excessive Hunger, Hair Changes, Heat Intolerance, Hot flashes and New Diabetes. Hematology Not Present- Blood Thinners, Easy Bruising, Excessive bleeding, Gland problems, HIV and Persistent Infections.  Vitals Chyrl [place] CMA; 12/06/2020 9:40 AM) 12/06/2020 9:39 AM Weight: 202.38 lb Height: 64in Body Surface Area: 1.97 m Body Mass Index: 34.74 kg/m  Temp.: 73F  Pulse: 90 (Regular)  P.OX: 98% (Room air) BP: 124/80(Sitting, Left Arm, Standard)        Physical Exam Sandra Sportsman MD; 12/06/2020 10:08 AM)  General Mental Status-Alert. General Appearance-Not in acute distress, Not Sickly. Orientation-Oriented X3. Hydration-Well hydrated. Voice-Normal.  Integumentary Global Assessment Upon inspection and palpation of skin surfaces of the - Axillae: non-tender, no inflammation or ulceration, no drainage. and Distribution of scalp and body hair is normal. General Characteristics Temperature - normal warmth is noted.  Head and Neck Head-normocephalic, atraumatic with no lesions or palpable masses. Face Global Assessment - atraumatic, no absence of expression. Neck Global Assessment - no abnormal movements, no bruit auscultated on the right, no bruit auscultated on the left, no decreased range of motion, non-tender. Trachea-midline. Thyroid Gland Characteristics - non-tender.  Eye Eyeball - Left-Extraocular movements intact, No Nystagmus - Left. Eyeball - Right-Extraocular movements intact, No Nystagmus - Right. Cornea - Left-No Hazy - Left. Cornea - Right-No Hazy - Right. Sclera/Conjunctiva - Left-No scleral icterus, No Discharge - Left. Sclera/Conjunctiva - Right-No scleral icterus, No Discharge - Right. Pupil - Left-Direct reaction to light normal. Pupil - Right-Direct reaction to light normal.  ENMT Ears Pinna - Left - no  drainage observed, no generalized tenderness observed. Pinna - Right - no drainage observed, no generalized tenderness observed. Nose and Sinuses External Inspection of the Nose - no destructive lesion observed. Inspection of the nares - Left - quiet respiration. Inspection of the nares - Right - quiet respiration. Mouth and Throat Lips - Upper Lip - no fissures observed, no pallor noted. Lower Lip - no fissures observed, no pallor noted. Nasopharynx - no discharge present. Oral Cavity/Oropharynx - Tongue - no  dryness observed. Oral Mucosa - no cyanosis observed. Hypopharynx - no evidence of airway distress observed.  Chest and Lung Exam Inspection Movements - Normal and Symmetrical. Accessory muscles - No use of accessory muscles in breathing. Palpation Palpation of the chest reveals - Non-tender. Auscultation Breath sounds - Normal and Clear.  Cardiovascular Auscultation Rhythm - Regular. Murmurs & Other Heart Sounds - Auscultation of the heart reveals - No Murmurs and No Systolic Clicks.  Abdomen Inspection Inspection of the abdomen reveals - No Visible peristalsis and No Abnormal pulsations. Umbilicus - No Bleeding, No Urine drainage. Palpation/Percussion Palpation and Percussion of the abdomen reveal - Soft, Non Tender, No Rebound tenderness, No Rigidity (guarding) and No Cutaneous hyperesthesia. Note: Abdomen soft. Not severely distended. No distasis recti. No umbilical or other anterior abdominal wall hernias  Female Genitourinary Sexual Maturity Tanner 5 - Adult hair pattern. Note: No vaginal bleeding nor discharge  Peripheral Vascular Upper Extremity Inspection - Left - No Cyanotic nailbeds - Left, Not Ischemic. Inspection - Right - No Cyanotic nailbeds - Right, Not Ischemic.  Neurologic Neurologic evaluation reveals -normal attention span and ability to concentrate, able to name objects and repeat phrases. Appropriate fund of knowledge , normal sensation and normal  coordination. Mental Status Affect - not angry, not paranoid. Cranial Nerves-Normal Bilaterally. Gait-Normal.  Neuropsychiatric Mental status exam performed with findings of-able to articulate well with normal speech/language, rate, volume and coherence, thought content normal with ability to perform basic computations and apply abstract reasoning and no evidence of hallucinations, delusions, obsessions or homicidal/suicidal ideation.  Musculoskeletal Global Assessment Spine, Ribs and Pelvis - no instability, subluxation or laxity. Right Upper Extremity - no instability, subluxation or laxity.  Lymphatic Head & Neck  General Head & Neck Lymphatics: Bilateral - Description - No Localized lymphadenopathy. Axillary  General Axillary Region: Bilateral - Description - No Localized lymphadenopathy. Femoral & Inguinal  Generalized Femoral & Inguinal Lymphatics: Left - Description - No Localized lymphadenopathy. Right - Description - No Localized lymphadenopathy.    Assessment & Plan Sandra Ashley(Sandra Ashley C. Aleksandra Raben MD; 12/06/2020 10:08 AM)  DIVERTICULITIS OF LARGE INTESTINE WITHOUT PERFORATION OR ABSCESS WITHOUT BLEEDING (K57.32) Impression: Episodes of recurrent diverticulitis over the past decade. Increasing in frequency and intensity with 3 bad attacks in the past 6 months. Last when going to the ER. Nodes malignancy.  I think she would benefit from segmental colonic resection. The worse inflammation seems to be in the proximal sigmoid. She might need a splenic flexure mobilization. We will see. Good candidate for robotic minimally invasive approach.  She is very interested in proceeding. She joked that her husband told her that she cannot leave the office unless we posted. We will get the ball rolling with this.  Current Plans Pt Education - [facility] Diverticular Disease (AT) You are being scheduled for surgery- Our schedulers will call you.  You should hear from our office's scheduling  department within 5 working days about the location, date, and time of surgery. We try to make accommodations for patient's preferences in scheduling surgery, but sometimes the OR schedule or the surgeon's schedule prevents us from making those accommodations.  If you have not heard from our office [phone]) in 5 working days, call the office and ask for your surgeon's nurse.  If you have other questions about your diagnosis, plan, or surgery, call the office and ask for your surgeon's nurse.   PREOP COLON - ENCOUNTER FOR PREOPERATIVE EXAMINATION FOR GENERAL SURGICAL PROCEDURE (Q65.784(Z01.818)  Current Plans Written instructions provided Pt  Education - Pamphlet Given - Laparoscopic Colorectal Surgery: discussed with patient and provided information. Pt Education - [facility] Colon Bowel Prep 2018 ERAS/Miralax/Antibiotics Started metroNIDAZOLE 500 MG Oral Tablet, 2 (two) Tablet three times daily, #6, 1 day starting 12/06/2020, No Refill. Local Order: Pharmacist Notes: Pharmacy Instructions: Take 2 tablets at 2pm, 3pm, and 10pm the day prior to your colon operation. Started Neomycin Sulfate 500 MG Oral Tablet, 2 (two) Tablet SEE NOTE, #6, 12/06/2020, No Refill. Local Order: Pharmacist Notes: TAKE TWO TABLETS AT 2 PM, 3 PM, AND 10 PM THE DAY PRIOR TO SURGERY Pt Education - [facility] Colectomy post-op instructions: discussed with patient and provided information. The anatomy & physiology of the digestive tract was discussed. The pathophysiology of the colon was discussed. Natural history risks without surgery was discussed. I feel the risks of no intervention will lead to serious problems that outweigh the operative risks; therefore, I recommended a partial colectomy to remove the pathology. Minimally invasive (Robotic/Laparoscopic) & open techniques were discussed.  Risks such as bleeding, infection, abscess, leak, reoperation, possible ostomy, hernia, heart attack, death, and other risks were discussed.  I noted a good likelihood this will help address the problem. Goals of post-operative recovery were discussed as well. Need for adequate nutrition, daily bowel regimen and healthy physical activity, to optimize recovery was noted as well. We will work to minimize complications. Educational materials were available as well. Questions were answered. The patient expresses understanding & wishes to proceed with surgery.   OBESITY (BMI 30-39.9) (E66.9)  Sandra Sportsman, MD, FACS, MASCRS  Esophageal, Gastrointestinal & Colorectal Surgery Robotic and Minimally Invasive Surgery [facility] [address], [address] [place], [place] [address] [phone] Fax [phone] Main/Paging  CONTACT INFORMATION: Weekday (9AM-5PM) concerns: Call [facility] main office at [phone] Weeknight (5PM-9AM) or Weekend/Holiday concerns: Check [email or web address] for General Surgery [facility] coverage (Please, do not use SecureChat as it is not reliable communication to operating surgeons for immediate patient care)

## 2021-01-03 NOTE — Progress Notes (Signed)
DUE TO COVID-19 ONLY ONE VISITOR IS ALLOWED TO COME WITH YOU AND STAY IN THE WAITING ROOM ONLY DURING PRE OP AND PROCEDURE DAY OF SURGERY. THE 1 VISITOR  MAY VISIT WITH YOU AFTER SURGERY IN YOUR PRIVATE ROOM DURING VISITING HOURS ONLY!  YOU NEED TO HAVE A COVID 19 TEST ON__6/20/22_____ @__0835am  ____, THIS TEST MUST BE DONE BEFORE SURGERY,  COVID TESTING SITE [address] [place] , IT IS ON THE RIGHT GOING OUT [address] APPROXIMATELY  2 MINUTES PAST [employer] ON THE RIGHT. ONCE YOUR COVID TEST IS COMPLETED,  PLEASE BEGIN THE QUARANTINE INSTRUCTIONS AS OUTLINED IN YOUR HANDOUT.                Sandra Ashley  01/03/2021   Your procedure is scheduled on:  01/24/21  Report to [facility] Main  Entrance   Report to admitting at     1130 AM     Call this number if you have problems the morning of surgery [phone]           REMEMBER: FOLLOW ALL YOUR BOWEL PREP INSTRUCTIONS WITH CLEAR LIQUIDS FROM YOUR SURGEON'S INSTRUCTIONS. DRINK 2 PRESURGERY ENSURE DRINKS THE NIGHT BEFORE SURGERY AT 1000 PM AND 1 PRESURGERY DRINK THE DAY OF THE PROCEDURE 3 HOURS PRIOR TO SCHEDULED SURGERY.  NOTHING BY MOUTH EXCEPT CLEAR LIQUIDS UNTIL THREE HOURS PRIOR TO SCHEDULED SURGERY. PLEASE FINISH PRESURGERY 3RD  ENSURE  DRINK PER SURGEON ORDER 3 HOURS PRIOR TO SCHEDULED SURGERY TIME WHICH NEEDS TO BE COMPLETED AT ___1030 am ______.     CLEAR LIQUID DIET   Foods Allowed                                                                      Coffee and tea, regular and decaf                           Plain Jell-O any favor except red or purple                                         Fruit ices (not with fruit pulp)                                      Iced Popsicles                                     Carbonated beverages, regular and diet                                    Cranberry, grape and apple juices Sports drinks like Gatorade Lightly seasoned clear broth or  consume(fat free) Sugar, honey syrup  _____________________________________________________________________      BRUSH YOUR TEETH MORNING OF SURGERY AND RINSE YOUR MOUTH OUT, NO CHEWING GUM CANDY OR MINTS.     Take these  medicines the morning of surgery with A SIP OF WATER:          DO NOT TAKE ANY DIABETIC MEDICATIONS DAY OF YOUR SURGERY                               You may not have any metal on your body including hair pins and              piercings  Do not wear jewelry, make-up, lotions, powders or perfumes, deodorant             Do not wear nail polish on your fingernails.  Do not shave  48 hours prior to surgery.              Men may shave face and neck.   Do not bring valuables to the hospital. [facility] IS NOT             RESPONSIBLE   FOR VALUABLES.  Contacts, dentures or bridgework may not be worn into surgery.  Leave suitcase in the car. After surgery it may be brought to your room.                 Please read over the following fact sheets you were given: _____________________________________________________________________  [place] [facility] - Preparing for Surgery Before surgery, you can play an important role.  Because skin is not sterile, your skin needs to be as free of germs as possible.  You can reduce the number of germs on your skin by washing with CHG (chlorahexidine gluconate) soap before surgery.  CHG is an antiseptic cleaner which kills germs and bonds with the skin to continue killing germs even after washing. Please DO NOT use if you have an allergy to CHG or antibacterial soaps.  If your skin becomes reddened/irritated stop using the CHG and inform your nurse when you arrive at Short Stay. Do not shave (including legs and underarms) for at least 48 hours prior to the first CHG shower.  You may shave your face/neck. Please follow these instructions carefully:  1.  Shower with CHG Soap the night before surgery and the  morning of Surgery.  2.  If you  choose to wash your hair, wash your hair first as usual with your  normal  shampoo.  3.  After you shampoo, rinse your hair and body thoroughly to remove the  shampoo.                           4.  Use CHG as you would any other liquid soap.  You can apply chg directly  to the skin and wash                       Gently with a scrungie or clean washcloth.  5.  Apply the CHG Soap to your body ONLY FROM THE NECK DOWN.   Do not use on face/ open                           Wound or open sores. Avoid contact with eyes, ears mouth and genitals (private parts).                       Wash face,  Genitals (private parts) with your normal soap.  6.  Wash thoroughly, paying special attention to the area where your surgery  will be performed.  7.  Thoroughly rinse your body with warm water from the neck down.  8.  DO NOT shower/wash with your normal soap after using and rinsing off  the CHG Soap.                9.  Pat yourself dry with a clean towel.            10.  Wear clean pajamas.            11.  Place clean sheets on your bed the night of your first shower and do not  sleep with pets. Day of Surgery : Do not apply any lotions/deodorants the morning of surgery.  Please wear clean clothes to the hospital/surgery center.  FAILURE TO FOLLOW THESE INSTRUCTIONS MAY RESULT IN THE CANCELLATION OF YOUR SURGERY PATIENT SIGNATURE_________________________________  NURSE SIGNATURE__________________________________  ________________________________________________________________________

## 2021-01-12 ENCOUNTER — Other Ambulatory Visit (HOSPITAL_COMMUNITY): Payer: Self-pay

## 2021-01-16 ENCOUNTER — Encounter (HOSPITAL_COMMUNITY)
Admission: RE | Admit: 2021-01-16 | Discharge: 2021-01-16 | Disposition: A | Discharge status: Home / Self Care | Payer: No Typology Code available for payment source | Source: Ambulatory Visit | Attending: Surgery | Admitting: Surgery

## 2021-01-16 ENCOUNTER — Encounter (HOSPITAL_COMMUNITY): Payer: Self-pay

## 2021-01-16 ENCOUNTER — Other Ambulatory Visit: Payer: Self-pay

## 2021-01-16 DIAGNOSIS — Z01812 Encounter for preprocedural laboratory examination: Secondary | ICD-10-CM | POA: Diagnosis not present

## 2021-01-16 HISTORY — DX: Unspecified asthma, uncomplicated: J45.909

## 2021-01-16 HISTORY — DX: Essential (primary) hypertension: I10

## 2021-01-16 HISTORY — DX: Depression, unspecified: F32.A

## 2021-01-16 HISTORY — DX: Personal history of urinary calculi: Z87.442

## 2021-01-16 HISTORY — DX: Gastro-esophageal reflux disease without esophagitis: K21.9

## 2021-01-16 LAB — BASIC METABOLIC PANEL
Anion gap: 6 (ref 5–15)
BUN: 15 mg/dL (ref 6–20)
CO2: 29 mmol/L (ref 22–32)
Calcium: 8.9 mg/dL (ref 8.9–10.3)
Chloride: 103 mmol/L (ref 98–111)
Creatinine, Ser: 1.06 mg/dL — ABNORMAL HIGH (ref 0.44–1.00)
GFR, Estimated: >60 mL/min (ref >60)
Glucose, Bld: 84 mg/dL (ref 70–99)
Potassium: 4 mmol/L (ref 3.5–5.1)
Sodium: 138 mmol/L (ref 135–145)

## 2021-01-16 LAB — CBC
HCT: 40.6 % (ref 36.0–46.0)
Hemoglobin: 13 g/dL (ref 12.0–15.0)
MCH: 28.4 pg (ref 26.0–34.0)
MCHC: 32 g/dL (ref 30.0–36.0)
MCV: 88.8 fL (ref 80.0–100.0)
Platelets: 235 10*3/uL (ref 150–400)
RBC: 4.57 MIL/uL (ref 3.87–5.11)
RDW: 13.2 % (ref 11.5–15.5)
WBC: 8.4 10*3/uL (ref 4.0–10.5)
nRBC: 0 % (ref 0.0–0.2)

## 2021-01-16 NOTE — Progress Notes (Addendum)
Anesthesia Review:  PCP: Dr Donette Larry  Cardiologist : none  Chest x-ray : EKG :10/01/20  Echo : Stress test: Cardiac Cath :  Activity level: can do a flight of stairs iwthout difficulty  Sleep Study/ CPAP : none  Fasting Blood Sugar :      / Checks Blood Sugar -- times a day:   Blood Thinner/ Instructions /Last Dose: ASA / Instructions/ Last Dose :   Osotmy nurse consult at preop appt.   Hgba1c-5.9 on 01/16/21

## 2021-01-16 NOTE — Consult Note (Signed)
WOC Nurse requested for preoperative stoma site marking  Discussed surgical procedure and stoma creation with patient.  Explained role of the WOC nurse team.  Provided the patient with educational booklet and provided samples of pouching options.  Answered patient questions  Examined patient sitting and standing in order to place the marking in the patient's visual field, away from any creases or abdominal contour issues and within the rectus muscle.    Marked for colostomy in the LLQ  _6___ cm to the left of the umbilicus and __2__cm below the umbilicus.     Patient's abdomen cleansed with CHG wipes at site markings, allowed to air dry prior to marking.Covered mark with thin film transparent dressing to preserve mark until date of surgery.   WOC Nurse team will follow up with patient after surgery for continue ostomy care and teaching.  Lailany Enoch [facility] MSN, RN,CWOCN, CNS, [employer] [phone]

## 2021-01-17 LAB — HEMOGLOBIN A1C
Hgb A1c MFr Bld: 5.9 % — ABNORMAL HIGH (ref 4.8–5.6)
Mean Plasma Glucose: 123 mg/dL

## 2021-01-22 ENCOUNTER — Other Ambulatory Visit (HOSPITAL_COMMUNITY)
Admission: RE | Admit: 2021-01-22 | Discharge: 2021-01-22 | Disposition: A | Discharge status: Home / Self Care | Payer: No Typology Code available for payment source | Source: Ambulatory Visit | Attending: Surgery | Admitting: Surgery

## 2021-01-22 DIAGNOSIS — Z20822 Contact with and (suspected) exposure to covid-19: Secondary | ICD-10-CM | POA: Insufficient documentation

## 2021-01-22 DIAGNOSIS — Z01812 Encounter for preprocedural laboratory examination: Secondary | ICD-10-CM | POA: Insufficient documentation

## 2021-01-22 LAB — SARS CORONAVIRUS 2 (TAT 6-24 HRS): SARS Coronavirus 2: NEGATIVE

## 2021-01-23 MED ORDER — SODIUM CHLORIDE 0.9 % IV SOLN
2.0000 g | INTRAVENOUS | Status: AC
  Administered 2021-01-24: 2 g via INTRAVENOUS
  Filled 2021-01-23: qty 2

## 2021-01-23 MED ORDER — BUPIVACAINE LIPOSOME 1.3 % IJ SUSP
20.0000 mL | Freq: Once | INTRAMUSCULAR | Status: DC
  Filled 2021-01-23: qty 20

## 2021-01-24 ENCOUNTER — Inpatient Hospital Stay (HOSPITAL_COMMUNITY)
Admission: RE | Admit: 2021-01-24 | Discharge: 2021-01-26 | DRG: 331 | Disposition: A | Discharge status: Home / Self Care | Payer: No Typology Code available for payment source | Source: Other Acute Inpatient Hospital | Attending: Surgery | Admitting: Surgery

## 2021-01-24 ENCOUNTER — Inpatient Hospital Stay (HOSPITAL_COMMUNITY): Payer: No Typology Code available for payment source | Admitting: Certified Registered Nurse Anesthetist

## 2021-01-24 ENCOUNTER — Encounter (HOSPITAL_COMMUNITY): Payer: Self-pay | Admitting: Surgery

## 2021-01-24 ENCOUNTER — Other Ambulatory Visit: Payer: Self-pay

## 2021-01-24 ENCOUNTER — Encounter (HOSPITAL_COMMUNITY)
Admission: RE | Disposition: A | Discharge status: Home / Self Care | Payer: Self-pay | Source: Other Acute Inpatient Hospital | Attending: Surgery

## 2021-01-24 DIAGNOSIS — Z841 Family history of disorders of kidney and ureter: Secondary | ICD-10-CM

## 2021-01-24 DIAGNOSIS — E876 Hypokalemia: Secondary | ICD-10-CM | POA: Diagnosis present

## 2021-01-24 DIAGNOSIS — Z825 Family history of asthma and other chronic lower respiratory diseases: Secondary | ICD-10-CM | POA: Diagnosis not present

## 2021-01-24 DIAGNOSIS — Z833 Family history of diabetes mellitus: Secondary | ICD-10-CM

## 2021-01-24 DIAGNOSIS — I700 Atherosclerosis of aorta: Secondary | ICD-10-CM | POA: Insufficient documentation

## 2021-01-24 DIAGNOSIS — Z20822 Contact with and (suspected) exposure to covid-19: Secondary | ICD-10-CM | POA: Diagnosis present

## 2021-01-24 DIAGNOSIS — G43909 Migraine, unspecified, not intractable, without status migrainosus: Secondary | ICD-10-CM | POA: Insufficient documentation

## 2021-01-24 DIAGNOSIS — E782 Mixed hyperlipidemia: Secondary | ICD-10-CM | POA: Diagnosis present

## 2021-01-24 DIAGNOSIS — F32A Depression, unspecified: Secondary | ICD-10-CM | POA: Diagnosis present

## 2021-01-24 DIAGNOSIS — N200 Calculus of kidney: Secondary | ICD-10-CM | POA: Insufficient documentation

## 2021-01-24 DIAGNOSIS — K5730 Diverticulosis of large intestine without perforation or abscess without bleeding: Secondary | ICD-10-CM | POA: Diagnosis present

## 2021-01-24 DIAGNOSIS — Z79899 Other long term (current) drug therapy: Secondary | ICD-10-CM

## 2021-01-24 DIAGNOSIS — J309 Allergic rhinitis, unspecified: Secondary | ICD-10-CM | POA: Diagnosis present

## 2021-01-24 DIAGNOSIS — R7303 Prediabetes: Secondary | ICD-10-CM | POA: Diagnosis present

## 2021-01-24 DIAGNOSIS — Z860100 Personal history of colon polyps, unspecified: Secondary | ICD-10-CM | POA: Insufficient documentation

## 2021-01-24 DIAGNOSIS — E669 Obesity, unspecified: Secondary | ICD-10-CM | POA: Diagnosis present

## 2021-01-24 DIAGNOSIS — Z8249 Family history of ischemic heart disease and other diseases of the circulatory system: Secondary | ICD-10-CM

## 2021-01-24 DIAGNOSIS — Z6834 Body mass index (BMI) 34.0-34.9, adult: Secondary | ICD-10-CM | POA: Diagnosis not present

## 2021-01-24 DIAGNOSIS — I10 Essential (primary) hypertension: Secondary | ICD-10-CM | POA: Diagnosis present

## 2021-01-24 DIAGNOSIS — K219 Gastro-esophageal reflux disease without esophagitis: Secondary | ICD-10-CM | POA: Diagnosis present

## 2021-01-24 DIAGNOSIS — Z8261 Family history of arthritis: Secondary | ICD-10-CM

## 2021-01-24 DIAGNOSIS — K5732 Diverticulitis of large intestine without perforation or abscess without bleeding: Secondary | ICD-10-CM | POA: Diagnosis present

## 2021-01-24 DIAGNOSIS — N736 Female pelvic peritoneal adhesions (postinfective): Secondary | ICD-10-CM | POA: Diagnosis present

## 2021-01-24 DIAGNOSIS — F329 Major depressive disorder, single episode, unspecified: Secondary | ICD-10-CM | POA: Insufficient documentation

## 2021-01-24 HISTORY — PX: PROCTOSCOPY: SHX2266

## 2021-01-24 LAB — TYPE AND SCREEN
ABO/RH(D): B NEG
Antibody Screen: NEGATIVE

## 2021-01-24 LAB — PREGNANCY, URINE: Preg Test, Ur: NEGATIVE

## 2021-01-24 LAB — ABO/RH: ABO/RH(D): B NEG

## 2021-01-24 SURGERY — COLECTOMY, SIGMOID, ROBOT-ASSISTED
Anesthesia: General

## 2021-01-24 MED ORDER — ONDANSETRON HCL 4 MG/2ML IJ SOLN
INTRAMUSCULAR | Status: DC | PRN
  Administered 2021-01-24: 4 mg via INTRAVENOUS

## 2021-01-24 MED ORDER — DIPHENHYDRAMINE HCL 50 MG/ML IJ SOLN: 12.5000 mg | Freq: Four times a day (QID) | INTRAMUSCULAR | Status: DC | PRN

## 2021-01-24 MED ORDER — ONDANSETRON HCL 4 MG PO TABS: 4.0000 mg | ORAL_TABLET | Freq: Four times a day (QID) | ORAL | Status: DC | PRN

## 2021-01-24 MED ORDER — BISACODYL 5 MG PO TBEC: 20.0000 mg | DELAYED_RELEASE_TABLET | Freq: Once | ORAL | Status: DC

## 2021-01-24 MED ORDER — ENALAPRILAT 1.25 MG/ML IV SOLN
0.6250 mg | Freq: Four times a day (QID) | INTRAVENOUS | Status: DC | PRN
  Filled 2021-01-24: qty 1

## 2021-01-24 MED ORDER — PROCHLORPERAZINE MALEATE 10 MG PO TABS
10.0000 mg | ORAL_TABLET | Freq: Four times a day (QID) | ORAL | Status: DC | PRN
  Filled 2021-01-24: qty 1

## 2021-01-24 MED ORDER — PROPOFOL 10 MG/ML IV BOLUS
INTRAVENOUS | Status: DC | PRN
  Administered 2021-01-24: 160 mg via INTRAVENOUS

## 2021-01-24 MED ORDER — PROMETHAZINE HCL 25 MG/ML IJ SOLN
INTRAMUSCULAR | Status: AC
  Filled 2021-01-24: qty 1

## 2021-01-24 MED ORDER — ALVIMOPAN 12 MG PO CAPS
12.0000 mg | ORAL_CAPSULE | ORAL | Status: AC
  Administered 2021-01-24: 12 mg via ORAL
  Filled 2021-01-24: qty 1

## 2021-01-24 MED ORDER — PHENYLEPHRINE 40 MCG/ML (10ML) SYRINGE FOR IV PUSH (FOR BLOOD PRESSURE SUPPORT)
PREFILLED_SYRINGE | INTRAVENOUS | Status: AC
  Filled 2021-01-24: qty 10

## 2021-01-24 MED ORDER — LIDOCAINE 2% (20 MG/ML) 5 ML SYRINGE
INTRAMUSCULAR | Status: DC | PRN
  Administered 2021-01-24: 100 mg via INTRAVENOUS

## 2021-01-24 MED ORDER — LACTATED RINGERS IV SOLN: INTRAVENOUS | Status: DC

## 2021-01-24 MED ORDER — LACTATED RINGERS IV SOLN: INTRAVENOUS | Status: DC | PRN

## 2021-01-24 MED ORDER — CHLORHEXIDINE GLUCONATE 0.12 % MT SOLN
15.0000 mL | Freq: Once | OROMUCOSAL | Status: AC
  Administered 2021-01-24: 15 mL via OROMUCOSAL

## 2021-01-24 MED ORDER — OXYCODONE HCL 5 MG/5ML PO SOLN: 5.0000 mg | Freq: Once | ORAL | Status: DC | PRN

## 2021-01-24 MED ORDER — MIDAZOLAM HCL 2 MG/2ML IJ SOLN
INTRAMUSCULAR | Status: AC
  Filled 2021-01-24: qty 2

## 2021-01-24 MED ORDER — METRONIDAZOLE 500 MG PO TABS
1000.0000 mg | ORAL_TABLET | ORAL | Status: DC
  Filled 2021-01-24: qty 2

## 2021-01-24 MED ORDER — ENSURE PRE-SURGERY PO LIQD
296.0000 mL | Freq: Once | ORAL | Status: DC
  Filled 2021-01-24: qty 296

## 2021-01-24 MED ORDER — MIDAZOLAM HCL 5 MG/5ML IJ SOLN
INTRAMUSCULAR | Status: DC | PRN
  Administered 2021-01-24: 2 mg via INTRAVENOUS

## 2021-01-24 MED ORDER — METOPROLOL TARTRATE 5 MG/5ML IV SOLN: 5.0000 mg | Freq: Four times a day (QID) | INTRAVENOUS | Status: DC | PRN

## 2021-01-24 MED ORDER — PROCHLORPERAZINE EDISYLATE 10 MG/2ML IJ SOLN: 5.0000 mg | Freq: Four times a day (QID) | INTRAMUSCULAR | Status: DC | PRN

## 2021-01-24 MED ORDER — SIMVASTATIN 20 MG PO TABS
10.0000 mg | ORAL_TABLET | Freq: Every day | ORAL | Status: DC
  Administered 2021-01-24 – 2021-01-25 (×2): 10 mg via ORAL
  Filled 2021-01-24 (×2): qty 1

## 2021-01-24 MED ORDER — NORETHINDRONE 0.35 MG PO TABS
1.0000 | ORAL_TABLET | Freq: Every day | ORAL | Status: DC
  Administered 2021-01-25: 0.35 mg via ORAL

## 2021-01-24 MED ORDER — POLYETHYLENE GLYCOL 3350 17 GM/SCOOP PO POWD
1.0000 | Freq: Once | ORAL | Status: DC
  Filled 2021-01-24: qty 255

## 2021-01-24 MED ORDER — ENSURE PRE-SURGERY PO LIQD
592.0000 mL | Freq: Once | ORAL | Status: DC
  Filled 2021-01-24: qty 592

## 2021-01-24 MED ORDER — ENOXAPARIN SODIUM 40 MG/0.4ML IJ SOSY
40.0000 mg | PREFILLED_SYRINGE | INTRAMUSCULAR | Status: DC
  Administered 2021-01-25 – 2021-01-26 (×2): 40 mg via SUBCUTANEOUS
  Filled 2021-01-24 (×2): qty 0.4

## 2021-01-24 MED ORDER — ENOXAPARIN SODIUM 40 MG/0.4ML IJ SOSY
40.0000 mg | PREFILLED_SYRINGE | Freq: Once | INTRAMUSCULAR | Status: AC
  Administered 2021-01-24: 40 mg via SUBCUTANEOUS
  Filled 2021-01-24: qty 0.4

## 2021-01-24 MED ORDER — MECLIZINE HCL 25 MG PO TABS: 50.0000 mg | ORAL_TABLET | Freq: Three times a day (TID) | ORAL | Status: DC | PRN

## 2021-01-24 MED ORDER — TRAMADOL HCL 50 MG PO TABS
50.0000 mg | ORAL_TABLET | Freq: Four times a day (QID) | ORAL | Status: DC | PRN
  Administered 2021-01-25 (×2): 100 mg via ORAL
  Filled 2021-01-24 (×3): qty 2

## 2021-01-24 MED ORDER — SODIUM CHLORIDE 0.9 % IV SOLN: 250.0000 mL | INTRAVENOUS | Status: DC | PRN

## 2021-01-24 MED ORDER — LACTATED RINGERS IR SOLN
Status: DC | PRN
  Administered 2021-01-24: 1

## 2021-01-24 MED ORDER — METHOCARBAMOL 500 MG PO TABS
1000.0000 mg | ORAL_TABLET | Freq: Four times a day (QID) | ORAL | Status: DC | PRN
  Administered 2021-01-25 (×2): 1000 mg via ORAL
  Filled 2021-01-24 (×2): qty 2

## 2021-01-24 MED ORDER — OMEPRAZOLE 20 MG PO CPDR
20.0000 mg | DELAYED_RELEASE_CAPSULE | Freq: Every day | ORAL | Status: DC
  Administered 2021-01-25 – 2021-01-26 (×2): 20 mg via ORAL
  Filled 2021-01-24 (×2): qty 1

## 2021-01-24 MED ORDER — CELECOXIB 200 MG PO CAPS
200.0000 mg | ORAL_CAPSULE | ORAL | Status: AC
  Administered 2021-01-24: 200 mg via ORAL
  Filled 2021-01-24: qty 1

## 2021-01-24 MED ORDER — ENSURE SURGERY PO LIQD
237.0000 mL | Freq: Two times a day (BID) | ORAL | Status: DC
  Administered 2021-01-25 – 2021-01-26 (×2): 237 mL via ORAL

## 2021-01-24 MED ORDER — KETAMINE HCL 10 MG/ML IJ SOLN
INTRAMUSCULAR | Status: DC | PRN
  Administered 2021-01-24: 20 mg via INTRAVENOUS
  Administered 2021-01-24: 25 mg via INTRAVENOUS

## 2021-01-24 MED ORDER — SODIUM CHLORIDE 0.9% FLUSH: 3.0000 mL | INTRAVENOUS | Status: DC | PRN

## 2021-01-24 MED ORDER — DIPHENHYDRAMINE HCL 12.5 MG/5ML PO ELIX: 12.5000 mg | ORAL_SOLUTION | Freq: Four times a day (QID) | ORAL | Status: DC | PRN

## 2021-01-24 MED ORDER — MELATONIN 3 MG PO TABS: 3.0000 mg | ORAL_TABLET | Freq: Every evening | ORAL | Status: DC | PRN

## 2021-01-24 MED ORDER — HYDROCHLOROTHIAZIDE 12.5 MG PO CAPS
12.5000 mg | ORAL_CAPSULE | Freq: Every day | ORAL | Status: DC
  Administered 2021-01-25 – 2021-01-26 (×2): 12.5 mg via ORAL
  Filled 2021-01-24 (×2): qty 1

## 2021-01-24 MED ORDER — ROCURONIUM BROMIDE 10 MG/ML (PF) SYRINGE
PREFILLED_SYRINGE | INTRAVENOUS | Status: DC | PRN
  Administered 2021-01-24: 100 mg via INTRAVENOUS
  Administered 2021-01-24: 30 mg via INTRAVENOUS

## 2021-01-24 MED ORDER — CALCIUM POLYCARBOPHIL 625 MG PO TABS
625.0000 mg | ORAL_TABLET | Freq: Two times a day (BID) | ORAL | Status: DC
  Administered 2021-01-24 – 2021-01-26 (×4): 625 mg via ORAL
  Filled 2021-01-24 (×4): qty 1

## 2021-01-24 MED ORDER — DEXAMETHASONE SODIUM PHOSPHATE 10 MG/ML IJ SOLN
INTRAMUSCULAR | Status: AC
  Filled 2021-01-24: qty 1

## 2021-01-24 MED ORDER — SUGAMMADEX SODIUM 200 MG/2ML IV SOLN
INTRAVENOUS | Status: DC | PRN
  Administered 2021-01-24: 400 mg via INTRAVENOUS

## 2021-01-24 MED ORDER — FLUTICASONE PROPIONATE 50 MCG/ACT NA SUSP
1.0000 | Freq: Every day | NASAL | Status: DC
  Administered 2021-01-25 – 2021-01-26 (×2): 1 via NASAL
  Filled 2021-01-24: qty 16

## 2021-01-24 MED ORDER — PROPOFOL 10 MG/ML IV BOLUS
INTRAVENOUS | Status: AC
  Filled 2021-01-24: qty 20

## 2021-01-24 MED ORDER — SODIUM CHLORIDE 0.9% FLUSH
3.0000 mL | Freq: Two times a day (BID) | INTRAVENOUS | Status: DC
  Administered 2021-01-24 – 2021-01-25 (×2): 3 mL via INTRAVENOUS

## 2021-01-24 MED ORDER — ROCURONIUM BROMIDE 10 MG/ML (PF) SYRINGE
PREFILLED_SYRINGE | INTRAVENOUS | Status: AC
  Filled 2021-01-24: qty 10

## 2021-01-24 MED ORDER — LIDOCAINE 2% (20 MG/ML) 5 ML SYRINGE
INTRAMUSCULAR | Status: AC
  Filled 2021-01-24: qty 5

## 2021-01-24 MED ORDER — BUPROPION HCL ER (XL) 150 MG PO TB24
150.0000 mg | ORAL_TABLET | Freq: Every day | ORAL | Status: DC
  Administered 2021-01-25 – 2021-01-26 (×2): 150 mg via ORAL
  Filled 2021-01-24 (×2): qty 1

## 2021-01-24 MED ORDER — BUPIVACAINE LIPOSOME 1.3 % IJ SUSP
INTRAMUSCULAR | Status: DC | PRN
  Administered 2021-01-24: 20 mL

## 2021-01-24 MED ORDER — ORAL CARE MOUTH RINSE: 15.0000 mL | Freq: Once | OROMUCOSAL | Status: AC

## 2021-01-24 MED ORDER — SPY AGENT GREEN - (INDOCYANINE FOR INJECTION)
INTRAMUSCULAR | Status: DC | PRN
  Administered 2021-01-24: 4 mL via INTRAVENOUS

## 2021-01-24 MED ORDER — ACETAMINOPHEN 500 MG PO TABS
1000.0000 mg | ORAL_TABLET | ORAL | Status: AC
  Administered 2021-01-24: 1000 mg via ORAL
  Filled 2021-01-24: qty 2

## 2021-01-24 MED ORDER — 0.9 % SODIUM CHLORIDE (POUR BTL) OPTIME
TOPICAL | Status: DC | PRN
  Administered 2021-01-24: 2000 mL

## 2021-01-24 MED ORDER — VITAMIN D 25 MCG (1000 UNIT) PO TABS
5000.0000 [IU] | ORAL_TABLET | Freq: Every day | ORAL | Status: DC
  Administered 2021-01-25 – 2021-01-26 (×2): 5000 [IU] via ORAL
  Filled 2021-01-24 (×2): qty 5

## 2021-01-24 MED ORDER — FENTANYL CITRATE (PF) 100 MCG/2ML IJ SOLN
INTRAMUSCULAR | Status: DC | PRN
  Administered 2021-01-24 (×2): 100 ug via INTRAVENOUS
  Administered 2021-01-24: 50 ug via INTRAVENOUS
  Administered 2021-01-24: 100 ug via INTRAVENOUS

## 2021-01-24 MED ORDER — HYDROMORPHONE HCL 1 MG/ML IJ SOLN
0.5000 mg | INTRAMUSCULAR | Status: DC | PRN
  Filled 2021-01-24: qty 1

## 2021-01-24 MED ORDER — OXYCODONE HCL 5 MG PO TABS: 5.0000 mg | ORAL_TABLET | Freq: Once | ORAL | Status: DC | PRN

## 2021-01-24 MED ORDER — MEPERIDINE HCL 50 MG/ML IJ SOLN: 6.2500 mg | INTRAMUSCULAR | Status: DC | PRN

## 2021-01-24 MED ORDER — LIP MEDEX EX OINT
TOPICAL_OINTMENT | CUTANEOUS | Status: AC
  Filled 2021-01-24: qty 7

## 2021-01-24 MED ORDER — ONDANSETRON HCL 4 MG/2ML IJ SOLN: 4.0000 mg | Freq: Four times a day (QID) | INTRAMUSCULAR | Status: DC | PRN

## 2021-01-24 MED ORDER — BUPIVACAINE-EPINEPHRINE (PF) 0.25% -1:200000 IJ SOLN
INTRAMUSCULAR | Status: AC
  Filled 2021-01-24: qty 60

## 2021-01-24 MED ORDER — HYDROMORPHONE HCL 1 MG/ML IJ SOLN: 0.2500 mg | INTRAMUSCULAR | Status: DC | PRN

## 2021-01-24 MED ORDER — GABAPENTIN 300 MG PO CAPS
300.0000 mg | ORAL_CAPSULE | ORAL | Status: AC
  Administered 2021-01-24: 300 mg via ORAL
  Filled 2021-01-24: qty 1

## 2021-01-24 MED ORDER — ACETAMINOPHEN 500 MG PO TABS
1000.0000 mg | ORAL_TABLET | Freq: Four times a day (QID) | ORAL | Status: DC
  Administered 2021-01-24 – 2021-01-26 (×6): 1000 mg via ORAL
  Filled 2021-01-24 (×7): qty 2

## 2021-01-24 MED ORDER — DEXAMETHASONE SODIUM PHOSPHATE 4 MG/ML IJ SOLN
INTRAMUSCULAR | Status: DC | PRN
  Administered 2021-01-24: 10 mg via INTRAVENOUS

## 2021-01-24 MED ORDER — OMEPRAZOLE MAGNESIUM 20 MG PO TBEC: 20.0000 mg | DELAYED_RELEASE_TABLET | Freq: Every day | ORAL | Status: DC

## 2021-01-24 MED ORDER — ALBUTEROL SULFATE (2.5 MG/3ML) 0.083% IN NEBU: 2.5000 mg | INHALATION_SOLUTION | RESPIRATORY_TRACT | Status: DC | PRN

## 2021-01-24 MED ORDER — PROMETHAZINE HCL 25 MG/ML IJ SOLN
6.2500 mg | INTRAMUSCULAR | Status: DC | PRN
Start: 2021-01-24 — End: 2021-01-24
  Administered 2021-01-24: 6.25 mg via INTRAVENOUS

## 2021-01-24 MED ORDER — SIMETHICONE 80 MG PO CHEW: 40.0000 mg | CHEWABLE_TABLET | Freq: Four times a day (QID) | ORAL | Status: DC | PRN

## 2021-01-24 MED ORDER — METHOCARBAMOL 1000 MG/10ML IJ SOLN
1000.0000 mg | Freq: Four times a day (QID) | INTRAVENOUS | Status: DC | PRN
  Filled 2021-01-24: qty 10

## 2021-01-24 MED ORDER — PHENYLEPHRINE 40 MCG/ML (10ML) SYRINGE FOR IV PUSH (FOR BLOOD PRESSURE SUPPORT)
PREFILLED_SYRINGE | INTRAVENOUS | Status: DC | PRN
  Administered 2021-01-24 (×2): 120 ug via INTRAVENOUS

## 2021-01-24 MED ORDER — ONDANSETRON HCL 4 MG/2ML IJ SOLN
INTRAMUSCULAR | Status: AC
  Filled 2021-01-24: qty 2

## 2021-01-24 MED ORDER — ALVIMOPAN 12 MG PO CAPS
12.0000 mg | ORAL_CAPSULE | Freq: Two times a day (BID) | ORAL | Status: DC
  Administered 2021-01-25: 12 mg via ORAL
  Filled 2021-01-24 (×3): qty 1

## 2021-01-24 MED ORDER — FENTANYL CITRATE (PF) 100 MCG/2ML IJ SOLN
INTRAMUSCULAR | Status: AC
  Filled 2021-01-24: qty 2

## 2021-01-24 MED ORDER — MAGIC MOUTHWASH
15.0000 mL | Freq: Four times a day (QID) | ORAL | Status: DC | PRN
  Filled 2021-01-24: qty 15

## 2021-01-24 MED ORDER — AMISULPRIDE (ANTIEMETIC) 5 MG/2ML IV SOLN: 10.0000 mg | Freq: Once | INTRAVENOUS | Status: DC | PRN

## 2021-01-24 MED ORDER — BUPIVACAINE-EPINEPHRINE (PF) 0.25% -1:200000 IJ SOLN
INTRAMUSCULAR | Status: DC | PRN
  Administered 2021-01-24: 60 mL via PERINEURAL

## 2021-01-24 MED ORDER — LIP MEDEX EX OINT
1.0000 "application " | TOPICAL_OINTMENT | Freq: Two times a day (BID) | CUTANEOUS | Status: DC
  Administered 2021-01-24 – 2021-01-26 (×3): 1 via TOPICAL
  Filled 2021-01-24 (×2): qty 7

## 2021-01-24 MED ORDER — FENTANYL CITRATE (PF) 250 MCG/5ML IJ SOLN
INTRAMUSCULAR | Status: AC
  Filled 2021-01-24: qty 5

## 2021-01-24 MED ORDER — SODIUM CHLORIDE 0.9 % IV SOLN
2.0000 g | Freq: Two times a day (BID) | INTRAVENOUS | Status: AC
  Administered 2021-01-25: 2 g via INTRAVENOUS
  Filled 2021-01-24: qty 2

## 2021-01-24 MED ORDER — NEOMYCIN SULFATE 500 MG PO TABS
1000.0000 mg | ORAL_TABLET | ORAL | Status: DC
  Filled 2021-01-24: qty 2

## 2021-01-24 MED ORDER — LACTATED RINGERS IV BOLUS: 1000.0000 mL | Freq: Three times a day (TID) | INTRAVENOUS | Status: DC | PRN

## 2021-01-24 MED ORDER — ALUM & MAG HYDROXIDE-SIMETH 200-200-20 MG/5ML PO SUSP: 30.0000 mL | Freq: Four times a day (QID) | ORAL | Status: DC | PRN

## 2021-01-24 SURGICAL SUPPLY — 113 items
APL PRP STRL LF DISP 70% ISPRP (MISCELLANEOUS)
APPLIER CLIP 5 13 M/L LIGAMAX5 (MISCELLANEOUS)
APPLIER CLIP ROT 10 11.4 M/L (STAPLE)
APR CLP MED LRG 11.4X10 (STAPLE)
APR CLP MED LRG 5 ANG JAW (MISCELLANEOUS)
BLADE EXTENDED COATED 6.5IN (ELECTRODE) IMPLANT
CANNULA REDUC XI 12-8 STAPL (CANNULA)
CANNULA REDUCER 12-8 DVNC XI (CANNULA) IMPLANT
CELLS DAT CNTRL 66122 CELL SVR (MISCELLANEOUS) IMPLANT
CHLORAPREP W/TINT 26 (MISCELLANEOUS) IMPLANT
CLIP APPLIE 5 13 M/L LIGAMAX5 (MISCELLANEOUS) IMPLANT
CLIP APPLIE ROT 10 11.4 M/L (STAPLE) IMPLANT
COVER SURGICAL LIGHT HANDLE (MISCELLANEOUS) ×4 IMPLANT
COVER TIP SHEARS 8 DVNC (MISCELLANEOUS) ×1 IMPLANT
COVER TIP SHEARS 8MM DA VINCI (MISCELLANEOUS) ×2
COVER WAND RF STERILE (DRAPES) ×2 IMPLANT
DECANTER SPIKE VIAL GLASS SM (MISCELLANEOUS) ×2 IMPLANT
DEVICE TROCAR PUNCTURE CLOSURE (ENDOMECHANICALS) IMPLANT
DRAIN CHANNEL 19F RND (DRAIN) IMPLANT
DRAPE ARM DVNC X/XI (DISPOSABLE) ×4 IMPLANT
DRAPE COLUMN DVNC XI (DISPOSABLE) ×1 IMPLANT
DRAPE DA VINCI XI ARM (DISPOSABLE) ×8
DRAPE DA VINCI XI COLUMN (DISPOSABLE) ×2
DRAPE SURG IRRIG POUCH 19X23 (DRAPES) ×2 IMPLANT
DRSG OPSITE POSTOP 4X10 (GAUZE/BANDAGES/DRESSINGS) IMPLANT
DRSG OPSITE POSTOP 4X6 (GAUZE/BANDAGES/DRESSINGS) ×1 IMPLANT
DRSG OPSITE POSTOP 4X8 (GAUZE/BANDAGES/DRESSINGS) IMPLANT
DRSG TEGADERM 2-3/8X2-3/4 SM (GAUZE/BANDAGES/DRESSINGS) ×10 IMPLANT
DRSG TEGADERM 4X4.75 (GAUZE/BANDAGES/DRESSINGS) ×5 IMPLANT
ELECT PENCIL ROCKER SW 15FT (MISCELLANEOUS) ×2 IMPLANT
ELECT REM PT RETURN 15FT ADLT (MISCELLANEOUS) ×2 IMPLANT
ENDOLOOP SUT PDS II  0 18 (SUTURE)
ENDOLOOP SUT PDS II 0 18 (SUTURE) IMPLANT
EVACUATOR SILICONE 100CC (DRAIN) IMPLANT
GAUZE SPONGE 2X2 8PLY STRL LF (GAUZE/BANDAGES/DRESSINGS) ×1 IMPLANT
GLOVE SURG LTX SZ8 (GLOVE) ×6 IMPLANT
GLOVE SURG UNDER LTX SZ8 (GLOVE) ×6 IMPLANT
GOWN STRL REUS W/TWL XL LVL3 (GOWN DISPOSABLE) ×6 IMPLANT
GRASPER SUT TROCAR 14GX15 (MISCELLANEOUS) IMPLANT
HOLDER FOLEY CATH W/STRAP (MISCELLANEOUS) ×2 IMPLANT
IRRIG SUCT STRYKERFLOW 2 WTIP (MISCELLANEOUS) ×2
IRRIGATION SUCT STRKRFLW 2 WTP (MISCELLANEOUS) ×1 IMPLANT
KIT PROCEDURE DA VINCI SI (MISCELLANEOUS)
KIT PROCEDURE DVNC SI (MISCELLANEOUS) IMPLANT
KIT SIGMOIDOSCOPE (SET/KITS/TRAYS/PACK) ×1 IMPLANT
KIT TURNOVER KIT A (KITS) ×2 IMPLANT
NDL INSUFFLATION 14GA 120MM (NEEDLE) ×1 IMPLANT
NEEDLE INSUFFLATION 14GA 120MM (NEEDLE) ×2 IMPLANT
PACK CARDIOVASCULAR III (CUSTOM PROCEDURE TRAY) ×2 IMPLANT
PACK COLON (CUSTOM PROCEDURE TRAY) ×2 IMPLANT
PAD POSITIONING PINK XL (MISCELLANEOUS) ×2 IMPLANT
PORT LAP GEL ALEXIS MED 5-9CM (MISCELLANEOUS) ×2 IMPLANT
PROTECTOR NERVE ULNAR (MISCELLANEOUS) ×4 IMPLANT
RELOAD STAPLE 45 3.5 BLU DVNC (STAPLE) IMPLANT
RELOAD STAPLE 45 4.3 GRN DVNC (STAPLE) IMPLANT
RELOAD STAPLE 60 3.5 BLU DVNC (STAPLE) IMPLANT
RELOAD STAPLE 60 4.3 GRN DVNC (STAPLE) IMPLANT
RELOAD STAPLER 3.5X45 BLU DVNC (STAPLE) IMPLANT
RELOAD STAPLER 3.5X60 BLU DVNC (STAPLE) IMPLANT
RELOAD STAPLER 4.3X45 GRN DVNC (STAPLE) ×1 IMPLANT
RELOAD STAPLER 4.3X60 GRN DVNC (STAPLE) IMPLANT
RETRACTOR WND ALEXIS 18 MED (MISCELLANEOUS) IMPLANT
RTRCTR WOUND ALEXIS 18CM MED (MISCELLANEOUS)
SCISSORS LAP 5X35 DISP (ENDOMECHANICALS) ×2 IMPLANT
SEAL CANN UNIV 5-8 DVNC XI (MISCELLANEOUS) ×3 IMPLANT
SEAL XI 5MM-8MM UNIVERSAL (MISCELLANEOUS) ×6
SEALER VESSEL DA VINCI XI (MISCELLANEOUS) ×2
SEALER VESSEL EXT DVNC XI (MISCELLANEOUS) ×1 IMPLANT
SOLUTION ELECTROLUBE (MISCELLANEOUS) ×2 IMPLANT
SPONGE GAUZE 2X2 STER 10/PKG (GAUZE/BANDAGES/DRESSINGS) ×1
STAPLER 45 DA VINCI SURE FORM (STAPLE)
STAPLER 45 SUREFORM DVNC (STAPLE) IMPLANT
STAPLER 60 DA VINCI SURE FORM (STAPLE) ×2
STAPLER 60 SUREFORM DVNC (STAPLE) IMPLANT
STAPLER CANNULA SEAL DVNC XI (STAPLE) ×1 IMPLANT
STAPLER CANNULA SEAL XI (STAPLE) ×2
STAPLER ECHELON POWER CIR 29 (STAPLE) IMPLANT
STAPLER ECHELON POWER CIR 31 (STAPLE) ×1 IMPLANT
STAPLER RELOAD 3.5X45 BLU DVNC (STAPLE)
STAPLER RELOAD 3.5X45 BLUE (STAPLE)
STAPLER RELOAD 3.5X60 BLU DVNC (STAPLE)
STAPLER RELOAD 3.5X60 BLUE (STAPLE)
STAPLER RELOAD 4.3X45 GREEN (STAPLE) ×2
STAPLER RELOAD 4.3X45 GRN DVNC (STAPLE) ×1
STAPLER RELOAD 4.3X60 GREEN (STAPLE)
STAPLER RELOAD 4.3X60 GRN DVNC (STAPLE)
STOPCOCK 4 WAY LG BORE MALE ST (IV SETS) ×4 IMPLANT
SURGILUBE 2OZ TUBE FLIPTOP (MISCELLANEOUS) IMPLANT
SUT MNCRL AB 4-0 PS2 18 (SUTURE) ×3 IMPLANT
SUT PDS AB 1 CT1 27 (SUTURE) ×4 IMPLANT
SUT PROLENE 0 CT 2 (SUTURE) IMPLANT
SUT PROLENE 2 0 KS (SUTURE) IMPLANT
SUT PROLENE 2 0 SH DA (SUTURE) IMPLANT
SUT SILK 2 0 (SUTURE)
SUT SILK 2 0 SH CR/8 (SUTURE) IMPLANT
SUT SILK 2-0 18XBRD TIE 12 (SUTURE) IMPLANT
SUT SILK 3 0 (SUTURE)
SUT SILK 3 0 SH CR/8 (SUTURE) ×2 IMPLANT
SUT SILK 3-0 18XBRD TIE 12 (SUTURE) IMPLANT
SUT V-LOC BARB 180 2/0GR6 GS22 (SUTURE)
SUT VIC AB 3-0 SH 18 (SUTURE) IMPLANT
SUT VIC AB 3-0 SH 27 (SUTURE)
SUT VIC AB 3-0 SH 27XBRD (SUTURE) IMPLANT
SUT VICRYL 0 UR6 27IN ABS (SUTURE) ×2 IMPLANT
SUTURE V-LC BRB 180 2/0GR6GS22 (SUTURE) IMPLANT
SYR 10ML ECCENTRIC (SYRINGE) ×2 IMPLANT
SYS LAPSCP GELPORT 120MM (MISCELLANEOUS)
SYSTEM LAPSCP GELPORT 120MM (MISCELLANEOUS) IMPLANT
TOWEL OR NON WOVEN STRL DISP B (DISPOSABLE) ×2 IMPLANT
TRAY FOLEY MTR SLVR 16FR STAT (SET/KITS/TRAYS/PACK) ×2 IMPLANT
TROCAR ADV FIXATION 5X100MM (TROCAR) ×2 IMPLANT
TUBING CONNECTING 10 (TUBING) ×4 IMPLANT
TUBING INSUFFLATION 10FT LAP (TUBING) ×2 IMPLANT

## 2021-01-24 NOTE — Anesthesia Postprocedure Evaluation (Signed)
Anesthesia Post Note  Patient: Sandra Ashley  Procedure(s) Performed: ROBOTIC RESECTION OF COLON SIGMOID RIGID PROCTOSCOPY     Patient location during evaluation: PACU Anesthesia Type: General Level of consciousness: awake and alert Pain management: pain level controlled Vital Signs Assessment: post-procedure vital signs reviewed and stable Respiratory status: spontaneous breathing, nonlabored ventilation and respiratory function stable Cardiovascular status: blood pressure returned to baseline and stable Postop Assessment: no apparent nausea or vomiting Anesthetic complications: no   No notable events documented.  Last Vitals:  Vitals:   01/24/21 1700 01/24/21 1715  BP: 134/82 140/89  Pulse: 67 75  Resp: (!) 9 (!) 9  Temp:    SpO2: 100% 100%    Last Pain:  Vitals:   01/24/21 1645  TempSrc:   PainSc: Asleep                 Lowella Curb

## 2021-01-24 NOTE — Anesthesia Preprocedure Evaluation (Signed)
Anesthesia Evaluation  Patient identified by MRN, date of birth, ID band Patient awake    Reviewed: Allergy & Precautions, NPO status , Patient's Chart, lab work & pertinent test results  Airway Mallampati: II  TM Distance: >3 FB Neck ROM: Full    Dental no notable dental hx.    Pulmonary asthma , former smoker,    Pulmonary exam normal breath sounds clear to auscultation       Cardiovascular hypertension, Pt. on medications negative cardio ROS Normal cardiovascular exam Rhythm:Regular Rate:Normal     Neuro/Psych Depression negative neurological ROS  negative psych ROS   GI/Hepatic Neg liver ROS, GERD  ,  Endo/Other  negative endocrine ROS  Renal/GU negative Renal ROS  negative genitourinary   Musculoskeletal negative musculoskeletal ROS (+)   Abdominal (+) + obese,   Peds negative pediatric ROS (+)  Hematology negative hematology ROS (+)   Anesthesia Other Findings   Reproductive/Obstetrics negative OB ROS                             Anesthesia Physical Anesthesia Plan  ASA: 2  Anesthesia Plan: General   Post-op Pain Management:    Induction: Intravenous  PONV Risk Score and Plan: 3 and Ondansetron, Dexamethasone, Midazolam and Treatment may vary due to age or medical condition  Airway Management Planned: Oral ETT  Additional Equipment:   Intra-op Plan:   Post-operative Plan: Extubation in OR  Informed Consent: I have reviewed the patients History and Physical, chart, labs and discussed the procedure including the risks, benefits and alternatives for the proposed anesthesia with the patient or authorized representative who has indicated his/her understanding and acceptance.     Dental advisory given  Plan Discussed with: CRNA  Anesthesia Plan Comments:         Anesthesia Quick Evaluation

## 2021-01-24 NOTE — H&P (Signed)
Sandra Ashley DOB: [birth year] Married / Language: English / Race: White Female  Patient Care Team: Georgann Housekeeper, MD as PCP - General (Internal Medicine) Karie Soda, MD as Consulting Physician (General Surgery) Kathi Der, MD as Consulting Physician (Gastroenterology)  ` Patient sent for surgical consultation at the request of Dr Levora Angel   Chief Complaint: Recurrent diverticulitis ` ` The patient is a pleasant obese nonsmoking nondiabetic female who struggled with episodes of recurrent diverticulitis.  She believes she had her first diagnosis in 2010.  Usually she gets a course of antibiotics and I get that under control.  She's pre-certain she's had at least a dozen episodes.  She's had more intense attacks the past 6 months.  3 episodes.  Hard to get off the antibiotics.  More intense pain.  She will get low back pain and get somewhat constipated/obstipated.  Not to the point of bleeding though.  She notes that diverticulitis runs in her family especially on her mother's side.  She is followed closely by [facility] gastroenterology.  She's had prior colonoscopies.  Nodes of any mass or tumor or polyps.  No definite stricture yet.  She had a right ovarian cyst removed laparoscopically in 1996.  No other abdominal surgeries.  She is not diabetic.  She does not smoke.  When she is not having attacks, she tends to move her bowels about twice a day.  She is taking Benefiber and avoiding heavy meat or fast food.  She still struggles with attacks.  Frustrated.  She's has to gone to the ER many times.   She can walk well.  Usually does a 4 mile walk every day.   No personal nor family history of GI/colon cancer, inflammatory bowel disease, irritable bowel syndrome, allergy such as Celiac Sprue, dietary/dairy problems, colitis, ulcers nor gastritis.  No recent sick contacts/gastroenteritis.  No travel outside the country.  No changes in diet.  No dysphagia to solids or liquids.  No significant  heartburn or reflux.  No melena, hematemesis, coffee ground emesis.  No evidence of prior gastric/peptic ulceration.  No new events - ready for surgery   (Review of systems as stated in this history (HPI) or in the review of systems.  Otherwise all other 12 point ROS are negative) ` ` ###########################################`   This patient encounter took 35 minutes today to perform the following: obtain history, perform exam, review outside records, interpret tests & imaging, counsel the patient on their diagnosis; and, document this encounter, including findings & plan in the electronic health record (EHR).     Past Surgical History Blanchie Dessert, [place]; 12-28-2020 9:35 AM) Colon Polyp Removal - Colonoscopy  Foot Surgery  Right.   Diagnostic Studies History Blanchie Dessert, [place]; 2020/12/28 9:35 AM) Colonoscopy  within last year Mammogram  1-3 years ago Pap Smear  1-5 years ago   Allergies Blanchie Dessert, CMA; 12-28-2020 9:36 AM) Allergies Reconciled    Medication History Blanchie Dessert, CMA; 2020-12-28 9:38 AM) traMADol HCl  (50MG  Tablet, Oral) Active. Albuterol Sulfate HFA  (108 (90 Base)MCG/ACT Aerosol Soln, Inhalation) Active. Camila  (0.35MG  Tablet, Oral) Active. hydroCHLOROthiazide  (12.5MG  Tablet, Oral) Active. Ondansetron HCl  (4MG  Tablet, Oral) Active. Simvastatin  (10MG  Tablet, Oral) Active. valACYclovir HCl  (1GM Tablet, Oral) Active.   Social History , ; 12/28/20 9:35 AM) Alcohol use  Remotely quit alcohol use. Caffeine use  Coffee, Tea. No drug use  Tobacco use  Former smoker.   Family History Blanchie Dessert, [place]; 2020/12/28 9:35 AM) Alcohol Abuse  Brother, Sister. Arthritis  Mother. Cancer  Sister. Diabetes Mellitus  Mother. Heart Disease  Father, Mother. Heart disease in female family member before age 19  Hypertension  Mother. Ischemic Bowel Disease  Brother, Mother, Sister. Kidney Disease  Mother. Respiratory Condition  Mother. Thyroid problems  Mother.    Pregnancy / Birth History Blanchie Dessert, [place]; 12/06/2020 9:35 AM) Age at menarche  13 years. Contraceptive History  Oral contraceptives. Gravida  1 Maternal age  72-25 Para  1 Regular periods    Other Problems Blanchie Dessert, [place]; 12/06/2020 9:35 AM) Asthma  Diverticulosis  Gastroesophageal Reflux Disease  Hemorrhoids  High blood pressure  Kidney Stone  Other disease, cancer, significant illness          Review of Systems General Not Present- Appetite Loss, Chills, Fatigue, Fever, Night Sweats, Weight Gain and Weight Loss. Skin Not Present- Change in Wart/Mole, Dryness, Hives, Jaundice, New Lesions, Non-Healing Wounds, Rash and Ulcer. HEENT Present- Seasonal Allergies and Wears glasses/contact lenses. Not Present- Earache, Hearing Loss, Hoarseness, Nose Bleed, Oral Ulcers, Ringing in the Ears, Sinus Pain, Sore Throat, Visual Disturbances and Yellow Eyes. Respiratory Present- Difficulty Breathing. Not Present- Bloody sputum, Chronic Cough, Snoring and Wheezing. Breast Not Present- Breast Mass, Breast Pain, Nipple Discharge and Skin Changes. Cardiovascular Not Present- Chest Pain, Difficulty Breathing Lying Down, Leg Cramps, Palpitations, Rapid Heart Rate, Shortness of Breath and Swelling of Extremities. Gastrointestinal Not Present- Abdominal Pain, Bloating, Bloody Stool, Change in Bowel Habits, Chronic diarrhea, Constipation, Difficulty Swallowing, Excessive gas, Gets full quickly at meals, Hemorrhoids, Indigestion, Nausea, Rectal Pain and Vomiting. Female Genitourinary Not Present- Frequency, Nocturia, Painful Urination, Pelvic Pain and Urgency. Musculoskeletal Not Present- Back Pain, Joint Pain, Joint Stiffness, Muscle Pain, Muscle Weakness and Swelling of Extremities. Neurological Not Present- Decreased Memory, Fainting, Headaches, Numbness, Seizures, Tingling, Tremor, Trouble walking and Weakness. Psychiatric Present- Depression. Not Present- Anxiety, Bipolar, Change in Sleep Pattern,  Fearful and Frequent crying. Endocrine Not Present- Cold Intolerance, Excessive Hunger, Hair Changes, Heat Intolerance, Hot flashes and New Diabetes. Hematology Not Present- Blood Thinners, Easy Bruising, Excessive bleeding, Gland problems, HIV and Persistent Infections.   Vitals Chyrl [place]atte Fox CMA; 12/06/2020 9:40 AM) 12/06/2020 9:39 AM Weight: 202.38 lb   Height: 64 in  Body Surface Area: 1.97 m   Body Mass Index: 34.74 kg/m   Temp.: 98 F    Pulse: 90 (Regular)    P.OX: 98% (Room air) BP: 124/80(Sitting, Left Arm, Standard)      01/24/2021 There were no vitals taken for this visit.          Physical Exam   General Mental Status - Alert. General Appearance - Not in acute distress, Not Sickly. Orientation - Oriented X3. Hydration - Well hydrated. Voice - Normal.   Integumentary Global Assessment Upon inspection and palpation of skin surfaces of the - Axillae: non-tender, no inflammation or ulceration, no drainage. and Distribution of scalp and body hair is normal. General Characteristics Temperature - normal warmth is noted.   Head and Neck Head - normocephalic, atraumatic with no lesions or palpable masses. Face Global Assessment - atraumatic, no absence of expression. Neck Global Assessment - no abnormal movements, no bruit auscultated on the right, no bruit auscultated on the left, no decreased range of motion, non-tender. Trachea - midline. Thyroid Gland Characteristics - non-tender.   Eye Eyeball - Left - Extraocular movements intact, No Nystagmus - Left. Eyeball - Right - Extraocular movements intact, No Nystagmus - Right. Cornea - Left - No Hazy - Left. Cornea -  Right - No Hazy - Right. Sclera/Conjunctiva - Left - No scleral icterus, No Discharge - Left. Sclera/Conjunctiva - Right - No scleral icterus, No Discharge - Right. Pupil - Left - Direct reaction to light normal. Pupil - Right - Direct reaction to light normal.   ENMT Ears Pinna - Left - no drainage  observed, no generalized tenderness observed. Pinna - Right - no drainage observed, no generalized tenderness observed. Nose and Sinuses External Inspection of the Nose - no destructive lesion observed. Inspection of the nares - Left - quiet respiration. Inspection of the nares - Right - quiet respiration. Mouth and Throat Lips - Upper Lip - no fissures observed, no pallor noted. Lower Lip - no fissures observed, no pallor noted. Nasopharynx - no discharge present. Oral Cavity/Oropharynx - Tongue - no dryness observed. Oral Mucosa - no cyanosis observed. Hypopharynx - no evidence of airway distress observed.   Chest and Lung Exam Inspection Movements - Normal and Symmetrical. Accessory muscles - No use of accessory muscles in breathing. Palpation Palpation of the chest reveals - Non-tender. Auscultation Breath sounds - Normal and Clear.   Cardiovascular Auscultation Rhythm - Regular. Murmurs & Other Heart Sounds - Auscultation of the heart reveals - No Murmurs and No Systolic Clicks.   Abdomen Inspection Inspection of the abdomen reveals - No Visible peristalsis and No Abnormal pulsations. Umbilicus - No Bleeding, No Urine drainage. Palpation/Percussion Palpation and Percussion of the abdomen reveal - Soft, Non Tender, No Rebound tenderness, No Rigidity (guarding) and No Cutaneous hyperesthesia. Note:  Abdomen soft.  Not severely distended.  No distasis recti.  No umbilical or other anterior abdominal wall hernias   Female Genitourinary Sexual Maturity Tanner 5 - Adult hair pattern. Note:  No vaginal bleeding nor discharge   Peripheral Vascular Upper Extremity Inspection - Left - No Cyanotic nailbeds - Left, Not Ischemic. Inspection - Right - No Cyanotic nailbeds - Right, Not Ischemic.   Neurologic Neurologic evaluation reveals  - normal attention span and ability to concentrate, able to name objects and repeat phrases. Appropriate fund of knowledge , normal sensation and normal  coordination. Mental Status Affect - not angry, not paranoid. Cranial Nerves - Normal Bilaterally. Gait - Normal.   Neuropsychiatric Mental status exam performed with findings of - able to articulate well with normal speech/language, rate, volume and coherence, thought content normal with ability to perform basic computations and apply abstract reasoning and no evidence of hallucinations, delusions, obsessions or homicidal/suicidal ideation.   Musculoskeletal Global Assessment Spine, Ribs and Pelvis - no instability, subluxation or laxity. Right Upper Extremity - no instability, subluxation or laxity.   Lymphatic Head & Neck   General Head & Neck Lymphatics: Bilateral - Description - No Localized lymphadenopathy. Axillary   General Axillary Region: Bilateral - Description - No Localized lymphadenopathy. Femoral & Inguinal   Generalized Femoral & Inguinal Lymphatics: Left - Description - No Localized lymphadenopathy. Right - Description - No Localized lymphadenopathy.       Assessment & Plan    DIVERTICULITIS OF LARGE INTESTINE WITHOUT PERFORATION OR ABSCESS WITHOUT BLEEDING (K57.32) Impression: Episodes of recurrent diverticulitis over the past decade. Increasing in frequency and intensity with 3 bad attacks in the past 6 months. Last when going to the ER. Nodes malignancy.   I think she would benefit from segmental colonic resection. The worse inflammation seems to be in the proximal sigmoid. She might need a splenic flexure mobilization. We will see. Good candidate for robotic minimally invasive approach.   The  anatomy & physiology of the digestive tract was discussed.  The pathophysiology of the colon was discussed.  Natural history risks without surgery was discussed.   I feel the risks of no intervention will lead to serious problems that outweigh the operative risks; therefore, I recommended a partial colectomy to remove the pathology.  Minimally invasive (Robotic/Laparoscopic)  & open techniques were discussed.   Risks such as bleeding, infection, abscess, leak, reoperation, possible ostomy, hernia, heart attack, death, and other risks were discussed.  I noted a good likelihood this will help address the problem.   Goals of post-operative recovery were discussed as well.   Need for adequate nutrition, daily bowel regimen and healthy physical activity, to optimize recovery was noted as well. We will work to minimize complications.  Educational materials were available as well.  Questions were answered.  The patient expresses understanding & wishes to proceed with surgery.     OBESITY (BMI 30-39.9) (E66.9)     Ardeth SportsmanSteven C. Florabel Faulks, MD, FACS, MASCRS Esophageal, Gastrointestinal & Colorectal Surgery Robotic and Minimally Invasive Surgery  [facility] [address] [place], [place] [id number] [phone] Fax [phone] Main/Paging  CONTACT INFORMATION: Weekday (9AM-5PM) concerns: Call [facility] main office at [phone] Weeknight (5PM-9AM) or Weekend/Holiday concerns: Check [email or web address] for General Surgery [facility] coverage (Please, do not use SecureChat as it is not reliable communication to operating surgeons for immediate patient care)      01/24/2021 11:33 AM

## 2021-01-24 NOTE — Transfer of Care (Signed)
Immediate Anesthesia Transfer of Care Note  Patient: Sandra Ashley  Procedure(s) Performed: ROBOTIC RESECTION OF COLON SIGMOID RIGID PROCTOSCOPY  Patient Location: PACU  Anesthesia Type:General  Level of Consciousness: drowsy, patient cooperative and responds to stimulation  Airway & Oxygen Therapy: Patient Spontanous Breathing and Patient connected to face mask oxygen  Post-op Assessment: Report given to RN, Post -op Vital signs reviewed and stable and Patient moving all extremities  Post vital signs: Reviewed and stable  Last Vitals:  Vitals Value Taken Time  BP 144/82 01/24/21 1549  Temp    Pulse 74 01/24/21 1556  Resp 9 01/24/21 1556  SpO2 100 % 01/24/21 1556  Vitals shown include unvalidated device data.  Last Pain:  Vitals:   01/24/21 1149  TempSrc: Oral  PainSc: 0-No pain         Complications: No notable events documented.

## 2021-01-24 NOTE — Op Note (Addendum)
01/24/2021  3:35 PM  PATIENT:  Sandra Ashley  50 y.o. female  Patient Care Team: Sandra Ashley, Karrar, MD as PCP - General (Internal Medicine) Sandra Ashley, Sandra Chirino, MD as Consulting Physician (General Surgery) Sandra Ashley, Parag, MD as Consulting Physician (Gastroenterology)  PRE-OPERATIVE DIAGNOSIS:  DIVERTICULITIS  POST-OPERATIVE DIAGNOSIS:  RECURRENT DESCENDING & SIGMOID DIVERTICULITIS  PROCEDURE:   ROBOTIC RESECTION OF COLON - DESCENDING & SIGMOID MOBILIZATION OF SPLENIC FLEXURE OF COLON TRANSVERSUS ABDOMINIS PLANE (TAP) BLOCK - BILATERAL ASSESSMENT OF TISSUE VASCULAR PERFUSION USING ICG FIREFLY IMMUNOFLUORESCENCE RIGID PROCTOSCOPY  SURGEON:  Sandra SportsmanSteven C. Lucciano Vitali, MD  ASSISTANT: Sandra LeveeAlicia Thomas, MD, FACS. An experienced assistant was required given the standard of surgical care given the complexity of the case.  This assistant was needed for exposure, dissection, suction, tissue approximation, retraction, perception, etc.  ANESTHESIA:     General  Regional TRANSVERSUS ABDOMINIS PLANE (TAP) nerve block for perioperative & postoperative pain control provided with liposomal bupivacaine (Experel) mixed with 0.25% bupivacaine as a Bilateral TAP block x 30mL each side at the level of the transverse abdominis & preperitoneal spaces along the flank at the anterior axillary line, from subcostal ridge to iliac crest under laparoscopic guidance   Local field block at port sites & extraction wound  EBL:  Total I/O In: 1100 [I.V.:1000; IV Piggyback:100] Out: 150 [Blood:150]  Delay start of Pharmacological VTE agent (>24hrs) due to surgical blood loss or risk of bleeding:  no  DRAINS: No  SPECIMEN:   DESCENDING & SIGMOID COLON (open end proximal) DISTAL ANASTOMOTIC RING (final distal margin)  DISPOSITION OF SPECIMEN:  PATHOLOGY  COUNTS:  YES  PLAN OF CARE: Admit to inpatient   PATIENT DISPOSITION:  PACU - hemodynamically stable.  INDICATION:    Pleasant patient with current episodes of  left-sided colon diverticulitis.  Seems more in the descending and proximal sigmoid colon.  Increasing attacks.  Requiring many rounds of antibiotics.  Surgical consultation requested by gastroenterology given repeated attacks I recommended segmental resection:  The anatomy & physiology of the digestive tract was discussed.  The pathophysiology was discussed.  Natural history risks without surgery was discussed.   I worked to give an overview of the disease and the frequent need to have multispecialty involvement.  I feel the risks of no intervention will lead to serious problems that outweigh the operative risks; therefore, I recommended a partial colectomy to remove the pathology.  Laparoscopic & open techniques were discussed.   Risks such as bleeding, infection, abscess, leak, reoperation, possible ostomy, hernia, heart attack, death, and other risks were discussed.  I noted a good likelihood this will help address the problem.   Goals of post-operative recovery were discussed as well.  We will work to minimize complications.  Educational materials on the pathology had been given in the office.  Questions were answered.    The patient expressed understanding & wished to proceed with surgery.  OR FINDINGS:   Patient had thickened descending and sigmoid colon with worse inflammation in the distal descending colon.  No abscess or perforation  No obvious metastatic disease on visceral parietal peritoneum or liver.  The anastomosis rests 13 cm from the anal verge by rigid proctoscopy.  CASE DATA:  Type of patient?: Elective [facility] Private Case  Status of Case? Elective Scheduled  Infection Present At Time Of Surgery (PATOS)?  NO  DESCRIPTION:   Informed consent was confirmed.  The patient underwent general anaesthesia without difficulty.  The patient was positioned appropriately.  VTE prevention in place.  The patient  was clipped, prepped, & draped in a sterile fashion.  Surgical timeout  confirmed our plan.  The patient was positioned in reverse Trendelenburg.  Abdominal entry was gained using Varess technique at the left subcostal ridge on the anterior abdominal wall.  No elevated EtCO2 noted.  Port placed.  Camera inspection revealed no injury.  Extra ports were carefully placed under direct laparoscopic visualization.  I reflected the greater omentum and the upper abdomen the small bowel in the upper abdomen.  The patient was carefully positioned.  The Intuitive daVinci robot was docked with camera & instruments carefully placed.  The patient had thickening adhesions of descending colon and sigmoid colon along the left lateral sidewall and pelvis to the left adnexa.   I mobilized the rectosigmoid colon & elevated it to put the main pedicle on tension.  I scored the base of peritoneum of the medial side of the mesentery of the elevated left colon from the ligament of Treitz to the mid rectum.   I elevated the sigmoid mesentery and entered into the retro-mesenteric plane. We were able to identify the left ureter and gonadal vessels. We kept those posterior within the retroperitoneum and elevated the left colon mesentery off that. I did isolate the inferior mesenteric artery (IMA) pedicle but did not ligate it yet.  I continued distally and got into the avascular plane posterior to the mesorectum, sparing the nervi ergentes.. This allowed me to help mobilize the rectum as well by freeing the mesorectum off the sacrum.  I stayed away from the right and left ureters.  I kept the lateral vascular pedicles to the rectum intact.  I skeletonized the lymph nodes off the inferior mesenteric artery pedicle.  I went down to its takeoff from the aorta.  I isolated the inferior mesenteric vein off of the ligament of Treitz just cephalad to that as well.  After confirming the left ureter was out of the way, I went ahead and ligated the inferior mesenteric artery pedicle just near its takeoff from the  aorta.  I did ligate the inferior mesenteric vein in a similar fashion.  We ensured hemostasis.  I continued medial to lateral dissection to free the left colon mesentery off the retroperitoneum going up towards the splenic flexure to allow good mobility and protect the colon mesentery.  I mobilized the left colon in a lateral to medial fashion off the retroperitoneum and sidewall attachments along the line of Toldt up towards the splenic flexure to ensure good mobilization of the remaining left colon to reach into the pelvis.   We then focused on mesorectal dissection.  Freed the mesorectum off the presacral plane until I was distal to the concerning region.  Freed off peritoneum on the lateral sidewalls as well and transected the mesentery of the lateral pedicles to get distal to the area of concern.  Came around anteriorly such that I had good circumferential mesorectal excision and a good margin distal to the area of concern.  I chose a region at the proximal/mid descending colon that was soft.  I transected the mesentery of the colon radially to preserve remaining colon blood supply.  We then chose a region for the proximal margin that would reach well for our planned anastomosis.  Transected the colon mesentery radially to preserve good collateral and marginal artery blood supply.  It was apparent that the proximal/mid descending colon was not going to reach down the pelvis well.  We therefore focused on more aggressive standard splenic flexure  mobilization.  Had already elevated the splenic flexure and transverse colon off the retroperitoneum and up over the kidney.  We reposition the patient person Sandra Ashley.  I found the greater omentum and transected it off the mid transverse colon and continued this more distally to get up to the splenic flexure.  The omentum was quite thickened with many folds but eventually we were to get to it safely.  Patient had a very high splenic flexure with an accessory  spleen.  Carefully freed off and found the mesenteric attachments to the inferior pancreatic ridge.  We transected the splenic flexure and distal transverse colon mesentery off the retroperitoneum at the inferior pancreas edge.  Came proximally to the midline until he came to a dominant middle colic pedicle that we left intact.  This provided complete mobilization of the entire left colon from the mid transverse colon.  The splenic flexure could reach down the pelvis much more easily.  The descending colon did have some ischemia distal to the planned area of transection.  Because of the splenic flexure section we wanted to ensure there was good perfusion of the splenic flexure and proximal descending colon for anastomosis.  We asked anesthesia to dilute the indocyanine green (ICG) to 10 mL and inject 3 mL intravenously with IV flush.  I switched to the NIR fluorescence (Firefly mode) imaging window on the daVinci platform.  I was able to see good light green visualization of blood vessels with good perfusion of tissues, confirming good tissue perfusion of both ends planned for anastomosis.  I transected at the proximal rectum using a robotic stapler.    We created an extraction incision through a small Pfannenstiel incision in the suprapubic region.  Placed a wound protector.  I was able to eviscerate the rectosigmoid and descending colon out the wound.   I clamped the colon proximal to this area using a reusable pursestringer device.  Passed a 2-0 Keith needle. I transected at the descending/sigmoid junction with a scalpel. I got healthy bleeding mucosa.  We sent the rectosigmoid colon specimen off to go to pathology.  We sized the colon orifice.  I chose a 61mm EEA anvil stapler system.  I reinforced the prolene pursestring with interrupted silk suture.  I placed the anvil to the open end of the proximal remaining colon and closed around it using the pursestring.    We did copious irrigation with crystalloid  solution.  Hemostasis was good.  I ran the small bowel such that it was no longer in the retroperitoneum so that the splenic flexure could lay down on the left side of the retroperitoneum well.  With that, the distal end of the remaining colon easily reached down to the rectal stump after splenis flexure mobilization.  Sandra Ashley scrubbed down and did gentle anal dilation and advanced the EEA stapler up the rectal stump. The spike was brought out at the provimal end of the rectal stump under direct visualization.  I  attached the anvil of the proximal colon the spike of the stapler. Anvil was tightened down and held clamped for 60 seconds. The EEA stapler was fired and held clamped for 30 seconds. The stapler was released & removed. We noted 2 excellent anastomotic rings. Blue stitch is in the proximal ring.  Sandra Ashley  did rigid proctoscopy noted the anastomosis was at 13 cm from the anal verge consistent with the proximal rectum.  We irrigation of isotonic solution & held that for the pelvic air leak  test .  The rectum was insufflated the rectum while clamping the colon proximal to that anastomosis.  There was a negative air leak test. There was no tension of mesentery or bowel at the anastomosis.   Tissues looked viable.  Ureters & bowel uninjured.  The anastomosis looked healthy. Greater omentum positioned down into the pelvis to help protect the anastomosis.  Endoluminal gas was evacuated.  Ports & wound protector removed.  We changed gloves & redraped the patient per colon SSI prevention protocol.  We aspirated the antibiotic irrigation.  Hemostasis was good.  Sterile unused instruments were used from this point.  I closed the skin at the port sites using Monocryl stitch and sterile dressing.  I closed the extraction wound using a 0 Vicryl vertical peritoneal closure and a #1 PDS transverse anterior rectal fascial closure like a small Pfannenstiel closure. I closed the skin with some interrupted Monocryl  stitches.  I placed sterile dressings.     Patient is being extubated go to recovery room. I had discussed postop care with the patient in detail the office & in the holding area. Instructions are written. I discussed operative findings, updated the patient's status, discussed probable steps to recovery, and gave postoperative recommendations to the patient's spouse, Sandra Ashley.  Recommendations were made.  Questions were answered.  He expressed understanding & appreciation.  Sandra Ashley, M.D., F.A.C.S. Gastrointestinal and Minimally Invasive Surgery [facility] [address], [address] [place], [place] [address] [phone] Main / Paging

## 2021-01-24 NOTE — Anesthesia Procedure Notes (Signed)
Procedure Name: Intubation Date/Time: 01/24/2021 12:40 PM Performed by: Vanessa [place], CRNA Pre-anesthesia Checklist: Patient identified, Emergency Drugs available, Suction available and Patient being monitored Patient Re-evaluated:Patient Re-evaluated prior to induction Oxygen Delivery Method: Circle system utilized Preoxygenation: Pre-oxygenation with 100% oxygen Induction Type: IV induction Ventilation: Mask ventilation without difficulty Laryngoscope Size: 2 and Miller Grade View: Grade I Tube type: Oral Tube size: 7.5 mm Number of attempts: 1 Airway Equipment and Method: Stylet Placement Confirmation: ETT inserted through vocal cords under direct vision, positive ETCO2 and breath sounds checked- equal and bilateral Secured at: 21 cm Tube secured with: Tape Dental Injury: Teeth and Oropharynx as per pre-operative assessment

## 2021-01-24 NOTE — Interval H&P Note (Signed)
History and Physical Interval Note:  01/24/2021 11:36 AM  Sandra Ashley  has presented today for surgery, with the diagnosis of DIVERTICULITIS.  The various methods of treatment have been discussed with the patient and family. After consideration of risks, benefits and other options for treatment, the patient has consented to  Procedure(s): ROBOTIC RESECTION OF COLON SIGMOID (N/A) RIGID PROCTOSCOPY (N/A) POSSIBLE OSTOMY (N/A) as a surgical intervention.  The patient's history has been reviewed, patient examined, no change in status, stable for surgery.  I have reviewed the patient's chart and labs.  Questions were answered to the patient's satisfaction.    I have re-reviewed the the patient's records, history, medications, and allergies.  I have re-examined the patient.  I again discussed intraoperative plans and goals of post-operative recovery.  The patient agrees to proceed.  Sandra Ashley  Jun 12, 1971 [id number]  Patient Care Team: Georgann Housekeeper, MD as PCP - General (Internal Medicine) Karie Soda, MD as Consulting Physician (General Surgery) Kathi Der, MD as Consulting Physician (Gastroenterology)  Patient Active Problem List   Diagnosis Date Noted   Plantar fasciitis of right foot 08/07/2015   Nontraumatic tear of plantar fascia 07/20/2015    Past Medical History:  Diagnosis Date   Asthma    Bilateral ovarian cysts    Depression    Diverticulitis    GERD (gastroesophageal reflux disease)    History of kidney stones    Hypertension     Past Surgical History:  Procedure Laterality Date   OVARIAN CYST SURGERY     RIGHT PLANTAR FASCIOTOMY       Social History   Socioeconomic History   Marital status: Married    Spouse name: Not on file   Number of children: Not on file   Years of education: Not on file   Highest education level: Not on file  Occupational History   Not on file  Tobacco Use   Smoking status: Former    Pack years: 0.00    Types: Cigarettes     Quit date: 08/05/2018    Years since quitting: 2.4   Smokeless tobacco: Not on file  Vaping Use   Vaping Use: Never used  Substance and Sexual Activity   Alcohol use: Yes    Comment: social    Drug use: No   Sexual activity: Not on file  Other Topics Concern   Not on file  Social History Narrative   Not on file   Social Determinants of Health   Financial Resource Strain: Not on file  Food Insecurity: Not on file  Transportation Needs: Not on file  Physical Activity: Not on file  Stress: Not on file  Social Connections: Not on file  Intimate Partner Violence: Not on file    No family history on file.  Medications Prior to Admission  Medication Sig Dispense Refill Last Dose   acetaminophen (TYLENOL) 500 MG tablet Take 1,000 mg by mouth every 6 (six) hours as needed for moderate pain or headache.      albuterol (VENTOLIN HFA) 108 (90 Base) MCG/ACT inhaler Inhale 2 puffs into the lungs every 4 (four) hours as needed for shortness of breath.      Bacillus Coagulans-Inulin (PROBIOTIC-PREBIOTIC PO) Take 2 capsules by mouth daily.      buPROPion (WELLBUTRIN XL) 150 MG 24 hr tablet Take 150 mg by mouth daily.      Cholecalciferol (DIALYVITE VITAMIN D 5000) 125 MCG (5000 UT) capsule Take 5,000 Units by mouth daily.  fluticasone (FLONASE) 50 MCG/ACT nasal spray Place 1 spray into both nostrils daily.      hydrochlorothiazide (MICROZIDE) 12.5 MG capsule Take 12.5 mg by mouth daily.      Multiple Vitamins-Minerals (ADULT GUMMY PO) Take 2 capsules by mouth daily.      Multiple Vitamins-Minerals (HAIR/SKIN/NAILS) CAPS Take 2 capsules by mouth daily.      norethindrone (MICRONOR) 0.35 MG tablet Take 1 tablet by mouth daily.      omeprazole (PRILOSEC OTC) 20 MG tablet Take 20 mg by mouth daily.      meclizine (ANTIVERT) 50 MG tablet Take 1 tablet (50 mg total) by mouth 3 (three) times daily as needed for dizziness. (Patient not taking: No sig reported) 30 tablet 0 Completed Course    ondansetron (ZOFRAN ODT) 4 MG disintegrating tablet Take 1 tablet (4 mg total) by mouth every 8 (eight) hours as needed for nausea or vomiting. (Patient not taking: No sig reported) 20 tablet 0 Completed Course   simvastatin (ZOCOR) 10 MG tablet Take 10 mg by mouth at bedtime.       Current Facility-Administered Medications  Medication Dose Route Frequency Provider Last Rate Last Admin   acetaminophen (TYLENOL) tablet 1,000 mg  1,000 mg Oral On Call to OR Karie Soda, MD       alvimopan (ENTEREG) capsule 12 mg  12 mg Oral On Call to OR Karie Soda, MD       bisacodyl (DULCOLAX) EC tablet 20 mg  20 mg Oral Once Karie Soda, MD       bupivacaine liposome (EXPAREL) 1.3 % injection 266 mg  20 mL Infiltration Once Royce Macadamia, RPH       cefoTEtan (CEFOTAN) 2 g in sodium chloride 0.9 % 100 mL IVPB  2 g Intravenous On Call to OR Royce Macadamia, RPH       celecoxib (CELEBREX) capsule 200 mg  200 mg Oral On Call to OR Karie Soda, MD       chlorhexidine (PERIDEX) 0.12 % solution 15 mL  15 mL Mouth/Throat Once Trevor Iha, MD       Or   MEDLINE mouth rinse  15 mL Mouth Rinse Once Trevor Iha, MD       enoxaparin (LOVENOX) injection 40 mg  40 mg Subcutaneous Once Karie Soda, MD       Melene Muller ON 01/25/2021] feeding supplement (ENSURE PRE-SURGERY) liquid 296 mL  296 mL Oral Once Karie Soda, MD       feeding supplement (ENSURE PRE-SURGERY) liquid 592 mL  592 mL Oral Once Karie Soda, MD       gabapentin (NEURONTIN) capsule 300 mg  300 mg Oral On Call to OR Karie Soda, MD       lactated ringers infusion   Intravenous Continuous Trevor Iha, MD       neomycin [facility]) tablet 1,000 mg  1,000 mg Oral 3 times per day Karie Soda, MD       And   metroNIDAZOLE (FLAGYL) tablet 1,000 mg  1,000 mg Oral 3 times per day Karie Soda, MD       polyethylene glycol powder (GLYCOLAX/MIRALAX) container 255 g  1 Container Oral Once Karie Soda, MD         Allergies   Allergen Reactions   Ibuprofen     vision problem   Hyoscyamine Sulfate     made pt feel loopy   Zithromax [Azithromycin] Rash    There were no vitals taken for  this visit.  Labs: No results found for this or any previous visit (from the past 48 hour(s)).  Imaging / Studies: No results found.   Ardeth Sportsman, M.D., F.A.C.S. Gastrointestinal and Minimally Invasive Surgery [facility] [address], [address] [place], [place] [address] [phone] Main / Paging  01/24/2021 11:36 AM    Ardeth Sportsman

## 2021-01-25 ENCOUNTER — Encounter (HOSPITAL_COMMUNITY): Payer: Self-pay | Admitting: Surgery

## 2021-01-25 DIAGNOSIS — E876 Hypokalemia: Secondary | ICD-10-CM

## 2021-01-25 LAB — CBC
HCT: 35.7 % — ABNORMAL LOW (ref 36.0–46.0)
Hemoglobin: 11.4 g/dL — ABNORMAL LOW (ref 12.0–15.0)
MCH: 28.5 pg (ref 26.0–34.0)
MCHC: 31.9 g/dL (ref 30.0–36.0)
MCV: 89.3 fL (ref 80.0–100.0)
Platelets: 218 10*3/uL (ref 150–400)
RBC: 4 MIL/uL (ref 3.87–5.11)
RDW: 13.2 % (ref 11.5–15.5)
WBC: 10.9 10*3/uL — ABNORMAL HIGH (ref 4.0–10.5)
nRBC: 0 % (ref 0.0–0.2)

## 2021-01-25 LAB — BASIC METABOLIC PANEL
Anion gap: 6 (ref 5–15)
BUN: 10 mg/dL (ref 6–20)
CO2: 26 mmol/L (ref 22–32)
Calcium: 8 mg/dL — ABNORMAL LOW (ref 8.9–10.3)
Chloride: 105 mmol/L (ref 98–111)
Creatinine, Ser: 0.92 mg/dL (ref 0.44–1.00)
GFR, Estimated: >60 mL/min (ref >60)
Glucose, Bld: 116 mg/dL — ABNORMAL HIGH (ref 70–99)
Potassium: 3.5 mmol/L (ref 3.5–5.1)
Sodium: 137 mmol/L (ref 135–145)

## 2021-01-25 LAB — MAGNESIUM: Magnesium: 1.7 mg/dL (ref 1.7–2.4)

## 2021-01-25 MED ORDER — POTASSIUM CHLORIDE CRYS ER 20 MEQ PO TBCR
40.0000 meq | EXTENDED_RELEASE_TABLET | Freq: Every day | ORAL | Status: DC
  Administered 2021-01-25 – 2021-01-26 (×2): 40 meq via ORAL
  Filled 2021-01-25 (×2): qty 2

## 2021-01-25 MED ORDER — MAGNESIUM SULFATE 2 GM/50ML IV SOLN
2.0000 g | Freq: Once | INTRAVENOUS | Status: AC
  Administered 2021-01-25: 2 g via INTRAVENOUS
  Filled 2021-01-25: qty 50

## 2021-01-25 MED ORDER — CHLORHEXIDINE GLUCONATE CLOTH 2 % EX PADS
6.0000 | MEDICATED_PAD | Freq: Every day | CUTANEOUS | Status: DC
  Administered 2021-01-26: 6 via TOPICAL

## 2021-01-25 NOTE — Progress Notes (Addendum)
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 Active Problems:   Allergic rhinitis   Diverticular disease of colon   Gastroesophageal reflux disease   Hypertension   Mixed hyperlipidemia   Prediabetes   Hypokalemia   Hypomagnesemia   1 Day Post-Op  01/24/2021   POST-OPERATIVE DIAGNOSIS:  RECURRENT DESCENDING & SIGMOID DIVERTICULITIS   PROCEDURE:   ROBOTIC RESECTION OF COLON - DESCENDING & SIGMOID MOBILIZATION OF SPLENIC FLEXURE OF COLON TRANSVERSUS ABDOMINIS PLANE (TAP) BLOCK - BILATERAL ASSESSMENT OF TISSUE VASCULAR PERFUSION USING ICG FIREFLY IMMUNOFLUORESCENCE RIGID PROCTOSCOPY   SURGEON:  Ardeth Sportsman, MD   ASSISTANT: Romie Levee, MD, FACS. An experienced assistant was required given the standard of surgical care given the complexity of the case.  This assistant was needed for exposure, dissection, suction, tissue approximation, retraction, perception, etc.      Assessment  Recovering  [facility] Stay = 1 days)  Plan:  -Follow-up on pathology -Enhanced recovery protocol -Stop IV fluids.  As needed -Replete hypomagnesemia and hypokalemia. -DC Foley -Hypertension control -norethindrone and as needed -GERD control-PPI.  Maalox as needed. -VTE prophylaxis- SCDs, etc -mobilize as tolerated to help recovery  Disposition:  Disposition:  The patient is from: Home  Anticipate discharge to:   Home  Anticipated Date of Discharge is:  June 23,2022    Barriers to discharge:  Pending Clinical improvement (more likely than not)  Patient currently is NOT MEDICALLY STABLE for discharge from the hospital from a surgery standpoint.      20 minutes spent in review, evaluation, examination, counseling, and coordination of care.   I have reviewed this patient's available data, including medical history, events of note, physical examination and test results as part of my evaluation.  A significant portion of that time was spent in counseling.  Care during the described time interval was provided by me.  I updated the patient's status to the patient and nurse  Recommendations were made.  Questions were answered.  They expressed understanding & appreciation.   01/25/2021    Subjective: (Chief complaint)  Pain control.  Walked in hallways.  Little sleepy.  Tolerated clear liquids.  Objective:  Vital signs:  Vitals:   01/24/21 2048 01/24/21 2146 01/25/21 0203 01/25/21 0548  BP: 119/69 114/64 112/65 111/62  Pulse: 75 80 81 80  Resp: 18 18 18 18   Temp: 98.1 F (36.7 C) 98 F (36.7 C) 98.9 F (37.2 C) 98.7 F (37.1 C)  TempSrc: Oral Oral Oral Oral  SpO2: 95% 98% 95% 96%  Weight:        Last BM Date: 01/22/21  Intake/Output   Yesterday:  06/22 0701 - 06/23 0700 In: 3468.5 [P.O.:840; I.V.:2528.5; IV Piggyback:100] Out: 2200 [Urine:2050; Blood:150] This shift:  No intake/output data recorded.  Bowel function:  Flatus: No  BM:  YES  Drain: (No drain)   Physical Exam:  General: Pt awake/alert in no acute distress Eyes: PERRL, normal EOM.  Sclera clear.  No icterus Neuro: CN II-XII intact w/o focal sensory/motor deficits. Lymph: No  head/neck/groin lymphadenopathy Psych:  No delerium/psychosis/paranoia.  Oriented x 4 HENT: Normocephalic, Mucus membranes moist.  No thrush Neck: Supple, No tracheal deviation.  No obvious thyromegaly Chest: No pain to chest  wall compression.  Good respiratory excursion.  No audible wheezing CV:  Pulses intact.  Regular rhythm.  No major extremity edema MS: Normal AROM mjr joints.  No obvious deformity  Abdomen: Soft.  Nondistended.  Mildly tender at incisions only.  No evidence of peritonitis.  No incarcerated hernias.  Ext:  No deformity.  No mjr edema.  No cyanosis Skin: No petechiae / purpurea.  No major sores.  Warm and dry    Results:   Cultures: Recent Results (from the past 720 hour(s))  SARS CORONAVIRUS 2 (TAT 6-24 HRS) Nasopharyngeal Nasopharyngeal Swab     Status: None   Collection Time: 01/22/21  8:23 AM   Specimen: Nasopharyngeal Swab  Result Value Ref Range Status   SARS Coronavirus 2 NEGATIVE NEGATIVE Final    Comment: (NOTE) SARS-CoV-2 target nucleic acids are NOT DETECTED.  The SARS-CoV-2 RNA is generally detectable in upper and lower respiratory specimens during the acute phase of infection. Negative results do not preclude SARS-CoV-2 infection, do not rule out co-infections with other pathogens, and should not be used as the sole basis for treatment or other patient management decisions. Negative results must be combined with clinical observations, patient history, and epidemiological information. The expected result is Negative.  Fact Sheet for Patients: HairSlick.no  Fact Sheet for Healthcare Providers: quierodirigir.com  This test is not yet approved or cleared by the [place] FDA and  has been authorized for detection and/or diagnosis of SARS-CoV-2 by FDA under an Emergency Use Authorization (EUA). This EUA will remain  in effect (meaning this test can be used) for the duration of the COVID-19 declaration under Se ction 564(b)(1) of the Act, 21 U.S.C. section 360bbb-3(b)(1), unless the authorization is terminated or revoked sooner.  Performed at [facility], [address], [place], [place] [address]      Labs: Results for orders placed or performed during the hospital encounter of 01/24/21 (from the past 48 hour(s))  Pregnancy, urine     Status: None   Collection Time: 01/24/21 11:17 AM  Result Value Ref Range   Preg Test, Ur NEGATIVE NEGATIVE    Comment:        THE SENSITIVITY OF THIS METHODOLOGY IS >20 mIU/mL. Performed at [facility], [address] [place], [place] [address]   ABO/Rh     Status: None   Collection Time: 01/24/21 11:45 AM  Result Value Ref Range   ABO/RH(D)      B NEG Performed at [facility], [address] [place], [place] [address]   Basic metabolic panel     Status: Abnormal   Collection Time: 01/25/21  4:58 AM  Result Value Ref Range   Sodium 137 135 - 145 mmol/L   Potassium 3.5 3.5 - 5.1 mmol/L   Chloride 105 98 - 111 mmol/L   CO2 26 22 - 32 mmol/L   Glucose, Bld 116 (H) 70 - 99 mg/dL    Comment: Glucose reference range applies only to samples taken after fasting for at least 8 hours.   BUN 10 6 - 20 mg/dL   Creatinine, Ser 8.33 0.44 - 1.00 mg/dL   Calcium 8.0 (L) 8.9 - 10.3 mg/dL   GFR, Estimated >82 >50 mL/min    Comment: (NOTE) Calculated using the CKD-EPI Creatinine  Equation (2021)    Anion gap 6 5 - 15    Comment: Performed at [facility], [address] [place], [place] [address]  CBC     Status: Abnormal   Collection Time: 01/25/21  4:58 AM  Result Value Ref Range   WBC 10.9 (H) 4.0 - 10.5 K/uL   RBC 4.00 3.87 - 5.11 MIL/uL   Hemoglobin 11.4 (L) 12.0 - 15.0 g/dL   HCT 74.9 (L) 44.9 - 67.5 %   MCV 89.3 80.0 - 100.0 fL   MCH 28.5 26.0 - 34.0 pg   MCHC 31.9 30.0 - 36.0 g/dL   RDW 91.6 38.4 - 66.5 %   Platelets 218 150 - 400 K/uL   nRBC 0.0 0.0 - 0.2 %    Comment: Performed at [facility], [address] [place], [place] [address]  Magnesium     Status: None   Collection Time: 01/25/21  4:58 AM  Result Value Ref Range   Magnesium 1.7 1.7 - 2.4  mg/dL    Comment: Performed at [facility], [address] [place], [place] [address]    Imaging / Studies: No results found.  Medications / Allergies: per chart  Antibiotics: Anti-infectives (From admission, onward)    Start     Dose/Rate Route Frequency Ordered Stop   01/25/21 0000  cefoTEtan (CEFOTAN) 2 g in sodium chloride 0.9 % 100 mL IVPB        2 g 200 mL/hr over 30 Minutes Intravenous Every 12 hours 01/24/21 1831 01/25/21 0045   01/24/21 1400  neomycin (MYCIFRADIN) tablet 1,000 mg  Status:  Discontinued       See Hyperspace for full Linked Orders Report.   1,000 mg Oral 3 times per day 01/24/21 1115 01/24/21 1831   01/24/21 1400  metroNIDAZOLE (FLAGYL) tablet 1,000 mg  Status:  Discontinued       See Hyperspace for full Linked Orders Report.   1,000 mg Oral 3 times per day 01/24/21 1115 01/24/21 1831   01/24/21 0600  cefoTEtan (CEFOTAN) 2 g in sodium chloride 0.9 % 100 mL IVPB        2 g 200 mL/hr over 30 Minutes Intravenous On call to O.R. 01/23/21 0738 01/24/21 1239         Note: Portions of this report may have been transcribed using voice recognition software. Every effort was made to ensure accuracy; however, inadvertent computerized transcription errors may be present.   Any transcriptional errors that result from this process are unintentional.    Ardeth Sportsman, MD, FACS, MASCRS Esophageal, Gastrointestinal & Colorectal Surgery Robotic and Minimally Invasive Surgery  [facility] [address] [place], [place] [address] [phone] Fax [phone] Main/Paging  CONTACT INFORMATION: Weekday (9AM-5PM) concerns: Call [facility] main office at [phone] Weeknight (5PM-9AM) or Weekend/Holiday concerns: Check [email or web address] for General Surgery [facility] coverage (Please, do not use SecureChat as it is not reliable communication to operating surgeons for immediate patient care)      01/25/2021  7:47 AM

## 2021-01-26 ENCOUNTER — Other Ambulatory Visit (HOSPITAL_COMMUNITY): Payer: Self-pay

## 2021-01-26 LAB — SURGICAL PATHOLOGY

## 2021-01-26 MED ORDER — TRAMADOL HCL 50 MG PO TABS
50.0000 mg | ORAL_TABLET | Freq: Four times a day (QID) | ORAL | 0 refills | Status: DC | PRN
  Filled 2021-01-26: qty 20, 4d supply, fill #0

## 2021-01-26 NOTE — Progress Notes (Signed)
Discharge instructions discussed with patient, verbalized agreement and understanding 

## 2021-01-26 NOTE — Discharge Instructions (Signed)
SURGERY: POST OP INSTRUCTIONS (Surgery for small bowel obstruction, colon resection, etc)   ######################################################################  EAT Gradually transition to a high fiber diet with a fiber supplement over the next few days after discharge  WALK Walk an hour a day.  Control your pain to do that.    CONTROL PAIN Control pain so that you can walk, sleep, tolerate sneezing/coughing, go up/down stairs.  HAVE A BOWEL MOVEMENT DAILY Keep your bowels regular to avoid problems.  OK to try a laxative to override constipation.  OK to use an antidairrheal to slow down diarrhea.  Call if not better after 2 tries  CALL IF YOU HAVE PROBLEMS/CONCERNS Call if you are still struggling despite following these instructions. Call if you have concerns not answered by these instructions  ######################################################################   DIET Follow a light diet the first few days at home.  Start with a bland diet such as soups, liquids, starchy foods, low fat foods, etc.  If you feel full, bloated, or constipated, stay on a ful liquid or pureed/blenderized diet for a few days until you feel better and no longer constipated. Be sure to drink plenty of fluids every day to avoid getting dehydrated (feeling dizzy, not urinating, etc.). Gradually add a fiber supplement to your diet over the next week.  Gradually get back to a regular solid diet.  Avoid fast food or heavy meals the first week as you are more likely to get nauseated. It is expected for your digestive tract to need a few months to get back to normal.  It is common for your bowel movements and stools to be irregular.  You will have occasional bloating and cramping that should eventually fade away.  Until you are eating solid food normally, off all pain medications, and back to regular activities; your bowels will not be normal. Focus on eating a low-fat, high fiber diet the rest of your life  (See Getting to Good Bowel Health, below).  CARE of your INCISION or WOUND It is good for closed incision and even open wounds to be washed every day.  Shower every day.  Short baths are fine.  Wash the incisions and wounds clean with soap & water.     If you have a closed incision(s), wash the incision with soap & water every day.  You may leave closed incisions open to air if it is dry.   You may cover the incision with clean gauze & replace it after your daily shower for comfort.  It is good for closed incisions and even open wounds to be washed every day.  Shower every day.  Short baths are fine.  Wash the incisions and wounds clean with soap & water.    You may leave closed incisions open to air if it is dry.   You may cover the incision with clean gauze & replace it after your daily shower for comfort.  TEGADERM:  You have clear gauze band-aid dressings over your closed incision(s).  Remove the dressings 3 days after surgery. = Saturday 6/25   If you have an open wound with a wound vac, see wound vac care instructions.     ACTIVITIES as tolerated Start light daily activities --- self-care, walking, climbing stairs-- beginning the day after surgery.  Gradually increase activities as tolerated.  Control your pain to be active.  Stop when you are tired.  Ideally, walk several times a day, eventually an hour a day.   Most people are back to most  day-to-day activities in a few weeks.  It takes 4-8 weeks to get back to unrestricted, intense activity. If you can walk 30 minutes without difficulty, it is safe to try more intense activity such as jogging, treadmill, bicycling, low-impact aerobics, swimming, etc. Save the most intensive and strenuous activity for last (Usually 4-8 weeks after surgery) such as sit-ups, heavy lifting, contact sports, etc.  Refrain from any intense heavy lifting or straining until you are off narcotics for pain control.  You will have off days, but things should  improve week-by-week. DO NOT PUSH THROUGH PAIN.  Let pain be your guide: If it hurts to do something, don't do it.  Pain is your body warning you to avoid that activity for another week until the pain goes down. You may drive when you are no longer taking narcotic prescription pain medication, you can comfortably wear a seatbelt, and you can safely make sudden turns/stops to protect yourself without hesitating due to pain. You may have sexual intercourse when it is comfortable. If it hurts to do something, stop.  MEDICATIONS Take your usually prescribed home medications unless otherwise directed.   Blood thinners:  Usually you can restart any strong blood thinners after the second postoperative day.  It is OK to take aspirin right away.     If you are on strong blood thinners (warfarin/Coumadin, Plavix, Xerelto, Eliquis, Pradaxa, etc), discuss with your surgeon, medicine PCP, and/or cardiologist for instructions on when to restart the blood thinner & if blood monitoring is needed (PT/INR blood check, etc).     PAIN CONTROL Pain after surgery or related to activity is often due to strain/injury to muscle, tendon, nerves and/or incisions.  This pain is usually short-term and will improve in a few months.  To help speed the process of healing and to get back to regular activity more quickly, DO THE FOLLOWING THINGS TOGETHER: Increase activity gradually.  DO NOT PUSH THROUGH PAIN Use Ice and/or Heat Try Gentle Massage and/or Stretching Take over the counter pain medication Take Narcotic prescription pain medication for more severe pain  Good pain control = faster recovery.  It is better to take more medicine to be more active than to stay in bed all day to avoid medications.  Increase activity gradually Avoid heavy lifting at first, then increase to lifting as tolerated over the next 6 weeks. Do not "push through" the pain.  Listen to your body and avoid positions and maneuvers than reproduce the  pain.  Wait a few days before trying something more intense Walking an hour a day is encouraged to help your body recover faster and more safely.  Start slowly and stop when getting sore.  If you can walk 30 minutes without stopping or pain, you can try more intense activity (running, jogging, aerobics, cycling, swimming, treadmill, sex, sports, weightlifting, etc.) Remember: If it hurts to do it, then don't do it! Use Ice and/or Heat You will have swelling and bruising around the incisions.  This will take several weeks to resolve. Ice packs or heating pads (6-8 times a day, 30-60 minutes at a time) will help sooth soreness & bruising. Some people prefer to use ice alone, heat alone, or alternate between ice & heat.  Experiment and see what works best for you.  Consider trying ice for the first few days to help decrease swelling and bruising; then, switch to heat to help relax sore spots and speed recovery. Shower every day.  Short baths are fine.  It feels good!  Keep the incisions and wounds clean with soap & water.   Try Gentle Massage and/or Stretching Massage at the area of pain many times a day Stop if you feel pain - do not overdo it Take over the counter pain medication This helps the muscle and nerve tissues become less irritable and calm down faster Choose ONE of the following over-the-counter anti-inflammatory medications: Acetaminophen 500mg  tabs (Tylenol) 1-2 pills with every meal and just before bedtime (avoid if you have liver problems or if you have acetaminophen in you narcotic prescription) Naproxen 220mg  tabs (ex. Aleve, Naprosyn) 1-2 pills twice a day (avoid if you have kidney, stomach, IBD, or bleeding problems) Ibuprofen 200mg  tabs (ex. Advil, Motrin) 3-4 pills with every meal and just before bedtime (avoid if you have kidney, stomach, IBD, or bleeding problems) Take with food/snack several times a day as directed for at least 2 weeks to help keep pain / soreness down & more  manageable. Take Narcotic prescription pain medication for more severe pain A prescription for strong pain control is often given to you upon discharge (for example: oxycodone/Percocet, hydrocodone/Norco/Vicodin, or tramadol/Ultram) Take your pain medication as prescribed. Be mindful that most narcotic prescriptions contain Tylenol (acetaminophen) as well - avoid taking too much Tylenol. If you are having problems/concerns with the prescription medicine (does not control pain, nausea, vomiting, rash, itching, etc.), please call us [phone] to see if we need to switch you to a different pain medicine that will work better for you and/or control your side effects better. If you need a refill on your pain medication, you must call the office before 4 pm and on weekdays only.  By federal law, prescriptions for narcotics cannot be called into a pharmacy.  They must be filled out on paper & picked up from our office by the patient or authorized caretaker.  Prescriptions cannot be filled after 4 pm nor on weekends.    WHEN TO CALL us [phone] Severe uncontrolled or worsening pain  Fever over 101 F (38.5 C) Concerns with the incision: Worsening pain, redness, rash/hives, swelling, bleeding, or drainage Reactions / problems with new medications (itching, rash, hives, nausea, etc.) Nausea and/or vomiting Difficulty urinating Difficulty breathing Worsening fatigue, dizziness, lightheadedness, blurred vision Other concerns If you are not getting better after two weeks or are noticing you are getting worse, contact our office [phone] for further advice.  We may need to adjust your medications, re-evaluate you in the office, send you to the emergency room, or see what other things we can do to help. The clinic staff is available to answer your questions during regular business hours (8:30am-5pm).  Please don't hesitate to call and ask to speak to one of our nurses for clinical concerns.    A  surgeon from [facility] is always on call at the hospitals 24 hours/day If you have a medical emergency, go to the nearest emergency room or call 911.  FOLLOW UP in our office One the day of your discharge from the hospital (or the next business weekday), please call [facility] to set up or confirm an appointment to see your surgeon in the office for a follow-up appointment.  Usually it is 2-3 weeks after your surgery.   If you have skin staples at your incision(s), let the office know so we can set up a time in the office for the nurse to remove them (usually around 10 days after surgery). Make sure  that you call for appointments the day of discharge (or the next business weekday) from the hospital to ensure a convenient appointment time. IF YOU HAVE DISABILITY OR FAMILY LEAVE FORMS, BRING THEM TO THE OFFICE FOR PROCESSING.  DO NOT GIVE THEM TO YOUR DOCTOR.  [facility] [address] [place], [place], [place]  [address] ? [phone] - Main [phone] - [place],  [phone] - Fax [email or web address]  GETTING TO GOOD BOWEL HEALTH. It is expected for your digestive tract to need a few months to get back to normal.  It is common for your bowel movements and stools to be irregular.  You will have occasional bloating and cramping that should eventually fade away.  Until you are eating solid food normally, off all pain medications, and back to regular activities; your bowels will not be normal.   Avoiding constipation The goal: ONE SOFT BOWEL MOVEMENT A DAY!    Drink plenty of fluids.  Choose water first. TAKE A FIBER SUPPLEMENT EVERY DAY THE REST OF YOUR LIFE During your first week back home, gradually add back a fiber supplement every day Experiment which form you can tolerate.   There are many forms such as powders, tablets, wafers, gummies, etc Psyllium bran (Metamucil), methylcellulose (Citrucel), Miralax or Glycolax,  Benefiber, Flax Seed.  Adjust the dose week-by-week (1/2 dose/day to 6 doses a day) until you are moving your bowels 1-2 times a day.  Cut back the dose or try a different fiber product if it is giving you problems such as diarrhea or bloating. Sometimes a laxative is needed to help jump-start bowels if constipated until the fiber supplement can help regulate your bowels.  If you are tolerating eating & you are farting, it is okay to try a gentle laxative such as double dose MiraLax, prune juice, or Milk of Magnesia.  Avoid using laxatives too often. Stool softeners can sometimes help counteract the constipating effects of narcotic pain medicines.  It can also cause diarrhea, so avoid using for too long. If you are still constipated despite taking fiber daily, eating solids, and a few doses of laxatives, call our office. Controlling diarrhea Try drinking liquids and eating bland foods for a few days to avoid stressing your intestines further. Avoid dairy products (especially milk & ice cream) for a short time.  The intestines often can lose the ability to digest lactose when stressed. Avoid foods that cause gassiness or bloating.  Typical foods include beans and other legumes, cabbage, broccoli, and dairy foods.  Avoid greasy, spicy, fast foods.  Every person has some sensitivity to other foods, so listen to your body and avoid those foods that trigger problems for you. Probiotics (such as active yogurt, Align, etc) may help repopulate the intestines and colon with normal bacteria and calm down a sensitive digestive tract Adding a fiber supplement gradually can help thicken stools by absorbing excess fluid and retrain the intestines to act more normally.  Slowly increase the dose over a few weeks.  Too much fiber too soon can backfire and cause cramping & bloating. It is okay to try and slow down diarrhea with a few doses of antidiarrheal medicines.   Bismuth subsalicylate (ex. Kayopectate, Pepto Bismol)  for a few doses can help control diarrhea.  Avoid if pregnant.   Loperamide (Imodium) can slow down diarrhea.  Start with one tablet (2mg ) first.  Avoid if you are having fevers or severe pain.  ILEOSTOMY PATIENTS WILL HAVE CHRONIC DIARRHEA  since their colon is not in use.    Drink plenty of liquids.  You will need to drink even more glasses of water/liquid a day to avoid getting dehydrated. Record output from your ileostomy.  Expect to empty the bag every 3-4 hours at first.  Most people with a permanent ileostomy empty their bag 4-6 times at the least.   Use antidiarrheal medicine (especially Imodium) several times a day to avoid getting dehydrated.  Start with a dose at bedtime & breakfast.  Adjust up or down as needed.  Increase antidiarrheal medications as directed to avoid emptying the bag more than 8 times a day (every 3 hours). Work with your wound ostomy nurse to learn care for your ostomy.  See ostomy care instructions. TROUBLESHOOTING IRREGULAR BOWELS 1) Start with a soft & bland diet. No spicy, greasy, or fried foods.  2) Avoid gluten/wheat or dairy products from diet to see if symptoms improve. 3) Miralax 17gm or flax seed mixed in [place]. water or juice-daily. May use 2-4 times a day as needed. 4) Gas-X, Phazyme, etc. as needed for gas & bloating.  5) Prilosec (omeprazole) over-the-counter as needed 6)  Consider probiotics (Align, Activa, etc) to help calm the bowels down  Call your doctor if you are getting worse or not getting better.  Sometimes further testing (cultures, endoscopy, X-ray studies, CT scans, bloodwork, etc.) may be needed to help diagnose and treat the cause of the diarrhea. [facility], [place], [place], [place], [place]  [address] [phone] - Main.    [phone]  - Toll Free.   [phone] - Fax [email or web address]

## 2021-01-26 NOTE — Discharge Summary (Signed)
Physician Discharge Summary    Patient ID: Sandra Ashley MRN: [id number] DOB/AGE: 1971-06-11  50 y.o.  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 01/24/2021 Active Problems:   Allergic rhinitis   Diverticular disease of colon   Gastroesophageal reflux disease   Hypertension   Mixed hyperlipidemia   Prediabetes   Hypokalemia   Hypomagnesemia   2 Days Post-Op  01/24/2021  POST-OPERATIVE DIAGNOSIS:  RECURRENT DESCENDING & SIGMOID DIVERTICULITIS   PROCEDURE:   ROBOTIC RESECTION OF COLON - DESCENDING & SIGMOID MOBILIZATION OF SPLENIC FLEXURE OF COLON TRANSVERSUS ABDOMINIS PLANE (TAP) BLOCK - BILATERAL ASSESSMENT OF TISSUE VASCULAR PERFUSION USING ICG FIREFLY IMMUNOFLUORESCENCE RIGID PROCTOSCOPY   SURGEON:  Ardeth Sportsman, MD    Consults: None  Hospital Course:   The patient underwent the surgery above.  Postoperatively, the patient gradually mobilized and advanced to a solid diet.  Pain and other symptoms were treated aggressively.    By the time of discharge, the patient was walking well the hallways, eating food, having flatus.  Pain was well-controlled on an oral medications.  Based on meeting discharge criteria and continuing to recover, I felt it was safe for the patient to be discharged from the hospital to further recover with close followup. Postoperative recommendations were discussed in detail.  They are written as well.  Discharged Condition: good  Discharge Exam: Blood pressure 112/68, pulse 67, temperature 98 F (36.7 C), temperature source Oral, resp. rate 16, weight 92.9 kg, last menstrual period 01/12/2021,  SpO2 98 %.  General: Pt awake/alert/oriented x4 in No acute distress Eyes: PERRL, normal EOM.  Sclera clear.  No icterus Neuro: CN II-XII intact w/o focal sensory/motor deficits. Lymph: No head/neck/groin lymphadenopathy Psych:  No delerium/psychosis/paranoia HENT: Normocephalic, Mucus membranes moist.  No thrush Neck: Supple, No tracheal deviation Chest: No chest wall pain w good excursion CV:  Pulses intact.  Regular rhythm MS: Normal AROM mjr joints.  No obvious deformity Abdomen: Soft.  Nondistended.  Mildly tender at incisions only.  No evidence of peritonitis.  No incarcerated hernias. Ext:  SCDs BLE.  No mjr edema.  No cyanosis Skin: No petechiae / purpura   Disposition:    Follow-up Information     Karie Soda, MD Follow up.   Specialties: General Surgery, Colon and Rectal Surgery Why: To follow up after your operation, To follow up after your hospital stay Contact information: [address] [place] [address] [phone]                 Discharge disposition: 01-Home or Self Care       Discharge Instructions     Call MD for:   Complete by: As directed    FEVER > 101.5 F  (temperatures < 101.5 F are not significant)   Call MD for:  extreme fatigue   Complete by: As directed    Call MD for:  persistant dizziness or light-headedness   Complete by: As directed    Call MD for:  persistant nausea and vomiting   Complete by: As directed    Call MD for:  redness, tenderness, or signs of infection (pain, swelling, redness, odor or green/yellow discharge around incision site)  Complete by: As directed    Call MD for:  severe uncontrolled pain   Complete by: As directed    Diet - low sodium heart healthy   Complete by: As directed    Start with a bland diet such as soups, liquids, starchy foods, low fat foods, etc. the first few days at home. Gradually advance to a solid, low-fat, high fiber diet by the end of the first week at home.    Add a fiber supplement to your diet (Metamucil, etc) If you feel full, bloated, or constipated, stay on a full liquid or pureed/blenderized diet for a few days until you feel better and are no longer constipated.   Discharge instructions   Complete by: As directed    See Discharge Instructions If you are not getting better after two weeks or are noticing you are getting worse, contact our office [phone] for further advice.  We may need to adjust your medications, re-evaluate you in the office, send you to the emergency room, or see what other things we can do to help. The clinic staff is available to answer your questions during regular business hours (8:30am-5pm).  Please don't hesitate to call and ask to speak to one of our nurses for clinical concerns.    A surgeon from [facility] is always on call at the hospitals 24 hours/day If you have a medical emergency, go to the nearest emergency room or call 911.   Discharge wound care:   Complete by: As directed    It is good for closed incisions and even open wounds to be washed every day.  Shower every day.  Short baths are fine.  Wash the incisions and wounds clean with soap & water.    You may leave closed incisions open to air if it is dry.   You may cover the incision with clean gauze & replace it after your daily shower for comfort.  TEGADERM:  You have clear gauze band-aid dressings over your closed incision(s).  Remove the dressings 3 days after surgery = Saturday 6/25   Driving Restrictions   Complete by: As directed    You may drive when: - you are no longer taking narcotic prescription pain medication - you can comfortably wear a seatbelt - you can safely make sudden turns/stops without pain.   Increase activity slowly   Complete by: As directed    Start light daily activities --- self-care, walking, climbing stairs- beginning the day after surgery.  Gradually increase activities as tolerated.  Control your pain  to be active.  Stop when you are tired.  Ideally, walk several times a day, eventually an hour a day.   Most people are back to most day-to-day activities in a few weeks.  It takes 4-6 weeks to get back to unrestricted, intense activity. If you can walk 30 minutes without difficulty, it is safe to try more intense activity such as jogging, treadmill, bicycling, low-impact aerobics, swimming, etc. Save the most intensive and strenuous activity for last (Usually 4-8 weeks after surgery) such as sit-ups, heavy lifting, contact sports, etc.  Refrain from any intense heavy lifting or straining until you are off narcotics for pain control.  You will have off days, but things should improve week-by-week. DO NOT PUSH THROUGH PAIN.  Let pain be your guide: If it hurts to do something, don't do it.   Lifting restrictions   Complete by: As directed    If you can walk 30  minutes without difficulty, it is safe to try more intense activity such as jogging, treadmill, bicycling, low-impact aerobics, swimming, etc. Save the most intensive and strenuous activity for last (Usually 4-8 weeks after surgery) such as sit-ups, heavy lifting, contact sports, etc.   Refrain from any intense heavy lifting or straining until you are off narcotics for pain control.  You will have off days, but things should improve week-by-week. DO NOT PUSH THROUGH PAIN.  Let pain be your guide: If it hurts to do something, don't do it.  Pain is your body warning you to avoid that activity for another week until the pain goes down.   May shower / Bathe   Complete by: As directed    May walk up steps   Complete by: As directed    Remove dressing in 72 hours   Complete by: As directed    Make sure all dressings are removed by the third day after surgery.  Leave incisions open to air.  OK to cover incisions with gauze or bandages as desired   Sexual Activity Restrictions   Complete by: As directed    You may have sexual intercourse when it is  comfortable. If it hurts to do something, stop.       Allergies as of 01/26/2021       Reactions   Ibuprofen    vision problem   Hyoscyamine Sulfate    made pt feel loopy   Zithromax [azithromycin] Rash        Medication List     TAKE these medications    acetaminophen 500 MG tablet Commonly known as: TYLENOL Take 1,000 mg by mouth every 6 (six) hours as needed for moderate pain or headache.   ADULT GUMMY PO Take 2 capsules by mouth daily.   Hair/Skin/Nails Caps Take 2 capsules by mouth daily.   albuterol 108 (90 Base) MCG/ACT inhaler Commonly known as: VENTOLIN HFA Inhale 2 puffs into the lungs every 4 (four) hours as needed for shortness of breath.   buPROPion 150 MG 24 hr tablet Commonly known as: WELLBUTRIN XL Take 150 mg by mouth daily.   Dialyvite Vitamin D 5000 125 MCG (5000 UT) capsule Generic drug: Cholecalciferol Take 5,000 Units by mouth daily.   fluticasone 50 MCG/ACT nasal spray Commonly known as: FLONASE Place 1 spray into both nostrils daily.   hydrochlorothiazide 12.5 MG capsule Commonly known as: MICROZIDE Take 12.5 mg by mouth daily.   norethindrone 0.35 MG tablet Commonly known as: MICRONOR Take 1 tablet by mouth daily.   omeprazole 20 MG tablet Commonly known as: PRILOSEC OTC Take 20 mg by mouth daily.   ondansetron 4 MG disintegrating tablet Commonly known as: Zofran ODT Take 1 tablet (4 mg total) by mouth every 8 (eight) hours as needed for nausea or vomiting.   PROBIOTIC-PREBIOTIC PO Take 2 capsules by mouth daily.   simvastatin 10 MG tablet Commonly known as: ZOCOR Take 10 mg by mouth at bedtime.   traMADol 50 MG tablet Commonly known as: ULTRAM Take 1-2 tablets (50-100 mg total) by mouth every 6 (six) hours as needed for moderate pain.       ASK your doctor about these medications    meclizine 50 MG tablet Commonly known as: ANTIVERT Take 1 tablet (50 mg total) by mouth 3 (three) times daily as needed for  dizziness.               Discharge Care Instructions  (From admission, onward)  Start     Ordered   01/26/21 0000  Discharge wound care:       Comments: It is good for closed incisions and even open wounds to be washed every day.  Shower every day.  Short baths are fine.  Wash the incisions and wounds clean with soap & water.    You may leave closed incisions open to air if it is dry.   You may cover the incision with clean gauze & replace it after your daily shower for comfort.  TEGADERM:  You have clear gauze band-aid dressings over your closed incision(s).  Remove the dressings 3 days after surgery = Saturday 6/25   01/26/21 0741            Significant Diagnostic Studies:  Results for orders placed or performed during the hospital encounter of 01/24/21 (from the past 72 hour(s))  Pregnancy, urine     Status: None   Collection Time: 01/24/21 11:17 AM  Result Value Ref Range   Preg Test, Ur NEGATIVE NEGATIVE    Comment:        THE SENSITIVITY OF THIS METHODOLOGY IS >20 mIU/mL. Performed at [facility], [address], [place], [place] [address]   ABO/Rh     Status: None   Collection Time: 01/24/21 11:45 AM  Result Value Ref Range   ABO/RH(D)      B NEG Performed at [facility], [address] [place], [place] [address]   Basic metabolic panel     Status: Abnormal   Collection Time: 01/25/21  4:58 AM  Result Value Ref Range   Sodium 137 135 - 145 mmol/L   Potassium 3.5 3.5 - 5.1 mmol/L   Chloride 105 98 - 111 mmol/L   CO2 26 22 - 32 mmol/L   Glucose, Bld 116 (H) 70 - 99 mg/dL    Comment: Glucose reference range applies only to samples taken after fasting for at least 8 hours.   BUN 10 6 - 20 mg/dL   Creatinine, Ser 0.98 0.44 - 1.00 mg/dL   Calcium 8.0 (L) 8.9 - 10.3 mg/dL   GFR, Estimated >11 >91 mL/min    Comment: (NOTE) Calculated using the CKD-EPI Creatinine Equation (2021)    Anion gap 6 5 - 15     Comment: Performed at [facility], [address] [place], [place] [address]  CBC     Status: Abnormal   Collection Time: 01/25/21  4:58 AM  Result Value Ref Range   WBC 10.9 (H) 4.0 - 10.5 K/uL   RBC 4.00 3.87 - 5.11 MIL/uL   Hemoglobin 11.4 (L) 12.0 - 15.0 g/dL   HCT 56.2 (L) 13.0 - 86.5 %   MCV 89.3 80.0 - 100.0 fL   MCH 28.5 26.0 - 34.0 pg   MCHC 31.9 30.0 - 36.0 g/dL   RDW 78.4 69.6 - 29.5 %   Platelets 218 150 - 400 K/uL   nRBC 0.0 0.0 - 0.2 %    Comment: Performed at [facility], [address] [place], [place] [address]  Magnesium     Status: None   Collection Time: 01/25/21  4:58 AM  Result Value Ref Range   Magnesium 1.7 1.7 - 2.4 mg/dL    Comment: Performed at [facility], [address], [place], [place] [address]    No results found.  Past Medical History:  Diagnosis Date   Asthma    Bilateral ovarian cysts  Depression    Diverticulitis    GERD (gastroesophageal reflux disease)    History of kidney stones    Hypertension     Past Surgical History:  Procedure Laterality Date   OVARIAN CYST SURGERY     PROCTOSCOPY N/A 01/24/2021   Procedure: RIGID PROCTOSCOPY;  Surgeon: Karie SodaGross, Demaya Hardge, MD;  Location: [facility];  Service: General;  Laterality: N/A;   RIGHT PLANTAR FASCIOTOMY       Social History   Socioeconomic History   Marital status: Married    Spouse name: Not on file   Number of children: Not on file   Years of education: Not on file   Highest education level: Not on file  Occupational History   Not on file  Tobacco Use   Smoking status: Former    Pack years: 0.00    Types: Cigarettes    Quit date: 08/05/2018    Years since quitting: 2.4   Smokeless tobacco: Not on file  Vaping Use   Vaping Use: Never used  Substance and Sexual Activity   Alcohol use: Yes    Comment: social    Drug use: No   Sexual activity: Not on file  Other Topics Concern   Not on file  Social History  Narrative   Not on file   Social Determinants of Health   Financial Resource Strain: Not on file  Food Insecurity: Not on file  Transportation Needs: Not on file  Physical Activity: Not on file  Stress: Not on file  Social Connections: Not on file  Intimate Partner Violence: Not on file    History reviewed. No pertinent family history.  Current Facility-Administered Medications  Medication Dose Route Frequency Provider Last Rate Last Admin   0.9 %  sodium chloride infusion  250 mL Intravenous PRN Karie SodaGross, Juliane Guest, MD       acetaminophen (TYLENOL) tablet 1,000 mg  1,000 mg Oral Q6H Karie SodaGross, Haydon Dorris, MD   1,000 mg at 01/26/21 0545   albuterol (PROVENTIL) (2.5 MG/3ML) 0.083% nebulizer solution 2.5 mg  2.5 mg Nebulization Q4H PRN Karie SodaGross, Shyloh Krinke, MD       alum & mag hydroxide-simeth (MAALOX/MYLANTA) 200-200-20 MG/5ML suspension 30 mL  30 mL Oral Q6H PRN Karie SodaGross, Shaelyn Decarli, MD       alvimopan (ENTEREG) capsule 12 mg  12 mg Oral BID Karie SodaGross, Brentney Goldbach, MD   12 mg at 01/25/21 [id number]   buPROPion (WELLBUTRIN XL) 24 hr tablet 150 mg  150 mg Oral Daily Karie SodaGross, Armari Fussell, MD   150 mg at 01/25/21 [id number]   Chlorhexidine Gluconate Cloth 2 % PADS 6 each  6 each Topical Daily Karie SodaGross, Bijon Mineer, MD       cholecalciferol (VITAMIN D3) tablet 5,000 Units  5,000 Units Oral Daily Karie SodaGross, Bernece Gall, MD   5,000 Units at 01/25/21 [id number]   diphenhydrAMINE (BENADRYL) 12.5 MG/5ML elixir 12.5 mg  12.5 mg Oral Q6H PRN Karie SodaGross, Shaquil Aldana, MD       Or   diphenhydrAMINE (BENADRYL) injection 12.5 mg  12.5 mg Intravenous Q6H PRN Karie SodaGross, Jaishaun Mcnab, MD       enalaprilat (VASOTEC) injection 0.625-1.25 mg  0.625-1.25 mg Intravenous Q6H PRN Karie SodaGross, Rajendra Spiller, MD       enoxaparin (LOVENOX) injection 40 mg  40 mg Subcutaneous Q24H Karie SodaGross, Hanifa Antonetti, MD   40 mg at 01/25/21 0839   feeding supplement (ENSURE SURGERY) liquid 237 mL  237 mL Oral BID BM Karie SodaGross, Alayah Knouff, MD   237 mL at 01/25/21 1439   fluticasone (FLONASE) 50 MCG/ACT nasal spray 1  spray  1 spray Each Nare Daily Karie Soda, MD   1 spray at 01/25/21 0841   hydrochlorothiazide (MICROZIDE) capsule 12.5 mg  12.5 mg Oral Daily Karie Soda, MD   12.5 mg at 01/25/21 0840   HYDROmorphone (DILAUDID) injection 0.5-2 mg  0.5-2 mg Intravenous Q4H PRN Karie Soda, MD       lactated ringers bolus 1,000 mL  1,000 mL Intravenous Q8H PRN Karie Soda, MD       lip balm (CARMEX) ointment 1 application  1 application Topical BID Karie Soda, MD   1 application at 01/25/21 5638   magic mouthwash  15 mL Oral QID PRN Karie Soda, MD       meclizine (ANTIVERT) tablet 50 mg  50 mg Oral TID PRN Karie Soda, MD       melatonin tablet 3 mg  3 mg Oral QHS PRN Karie Soda, MD       methocarbamol (ROBAXIN) 1,000 mg in dextrose 5 % 100 mL IVPB  1,000 mg Intravenous Q6H PRN Karie Soda, MD       methocarbamol (ROBAXIN) tablet 1,000 mg  1,000 mg Oral Q6H PRN Karie Soda, MD   1,000 mg at 01/25/21 2103   metoprolol tartrate (LOPRESSOR) injection 5 mg  5 mg Intravenous Q6H PRN Karie Soda, MD       norethindrone (MICRONOR) 0.35 MG tablet 0.35 mg  1 tablet Oral Laurena Slimmer, MD   0.35 mg at 01/25/21 2105   omeprazole (PRILOSEC) capsule 20 mg  20 mg Oral Daily Karie Soda, MD   20 mg at 01/25/21 1016   ondansetron (ZOFRAN) tablet 4 mg  4 mg Oral Q6H PRN Karie Soda, MD       Or   ondansetron [facility]) injection 4 mg  4 mg Intravenous Q6H PRN Karie Soda, MD       polycarbophil (FIBERCON) tablet 625 mg  625 mg Oral BID Karie Soda, MD   625 mg at 01/25/21 2104   potassium chloride SA (KLOR-CON) CR tablet 40 mEq  40 mEq Oral Daily Karie Soda, MD   40 mEq at 01/25/21 1016   prochlorperazine (COMPAZINE) tablet 10 mg  10 mg Oral Q6H PRN Karie Soda, MD       Or   prochlorperazine (COMPAZINE) injection 5-10 mg  5-10 mg Intravenous Q6H PRN Karie Soda, MD       simethicone (MYLICON) chewable tablet 40 mg  40 mg Oral Q6H PRN Karie Soda, MD       simvastatin (ZOCOR) tablet 10 mg  10 mg Oral Laurena Slimmer,  MD   10 mg at 01/25/21 2104   sodium chloride flush (NS) 0.9 % injection 3 mL  3 mL Intravenous Catha Gosselin, MD   3 mL at 01/25/21 2106   sodium chloride flush (NS) 0.9 % injection 3 mL  3 mL Intravenous PRN Karie Soda, MD       traMADol Janean Sark) tablet 50-100 mg  50-100 mg Oral Q6H PRN Karie Soda, MD   100 mg at 01/25/21 2220     Allergies  Allergen Reactions   Ibuprofen     vision problem   Hyoscyamine Sulfate     made pt feel loopy   Zithromax [Azithromycin] Rash    Signed: Lorenso Courier, MD, FACS, MASCRS Esophageal, Gastrointestinal & Colorectal Surgery Robotic and Minimally Invasive Surgery  [facility] [address] [place], [place] [address] [phone] Fax [phone]  Main/Paging  CONTACT INFORMATION: Weekday (9AM-5PM) concerns: Call [facility] main office at [phone] Weeknight (5PM-9AM) or Weekend/Holiday concerns: Check [email or web address] for General Surgery [facility] coverage (Please, do not use SecureChat as it is not reliable communication to operating surgeons for immediate patient care)      01/26/2021, 7:46 AM

## 2021-08-02 IMAGING — MR MR HEAD W/O CM
9 of 11 series · 32 of 48 positions shown · non-contrast
Comparison: None.

CLINICAL DATA: 49-year-old female with dizziness and nausea since
beginning Amoxicillin and Tramadol last week.

EXAM:
MRI HEAD WITHOUT CONTRAST
TECHNIQUE: Multiplanar, multiecho pulse sequences of the brain and surrounding
structures were obtained without intravenous contrast.

[Series 2: DWI · axial · 3.0mm · 0.94mm/px · z∈[-113,+29]mm · 7 of 100 slices shown (1 of 2)]
[im 1/100]
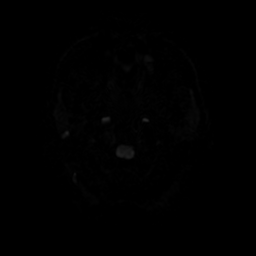
[im 17/100]
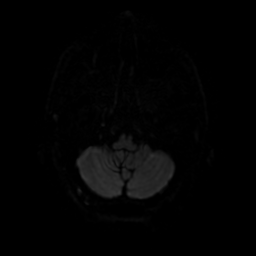
[im 34/100]
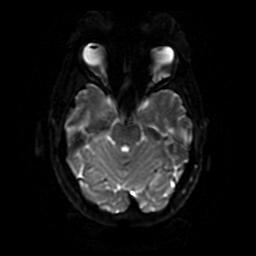
[im 50/100]
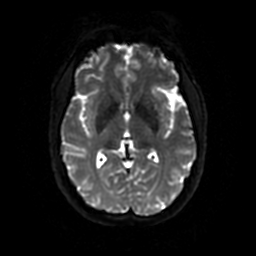
[im 67/100]
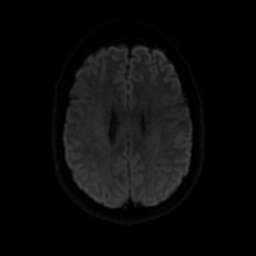
[im 83/100]
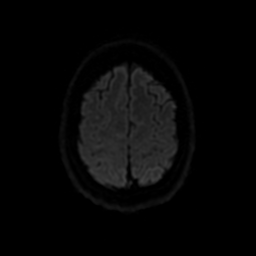
[im 100/100]
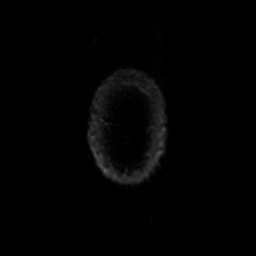

[Series 3: DWI · coronal · 4.0mm · 0.94mm/px · 6 of 74 slices shown (2 of 2)]
[im 1/74]
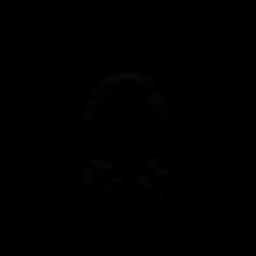
[im 15/74]
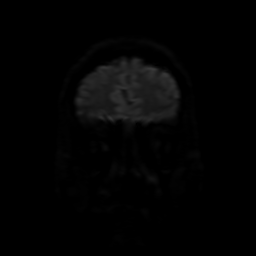
[im 30/74]
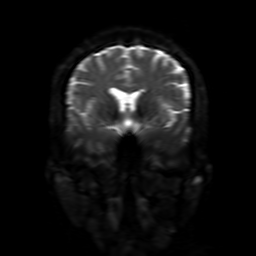
[im 44/74]
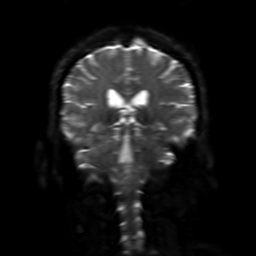
[im 59/74]
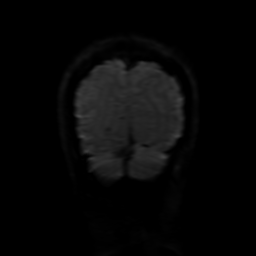
[im 74/74]
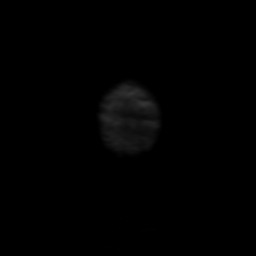

[Series 4: FLAIR · sagittal · 5.0mm · 0.47mm/px · 2 of 25 slices shown (1 of 2)]
[im 1/25]
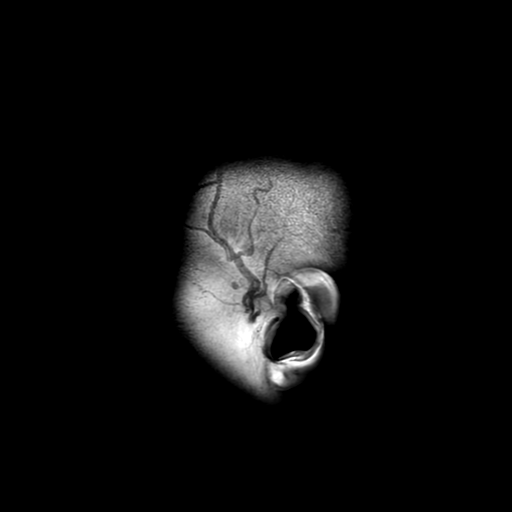
[im 25/25]
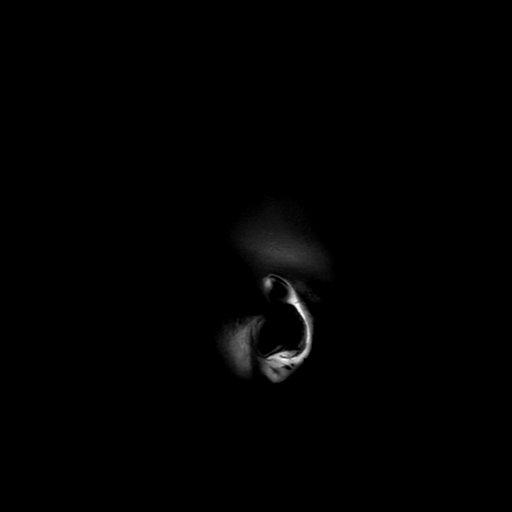

[Series 5: T2 · axial · 5.0mm · 0.47mm/px · z∈[-111,+29]mm · 2 of 25 slices shown (1 of 2)]
[im 1/25]
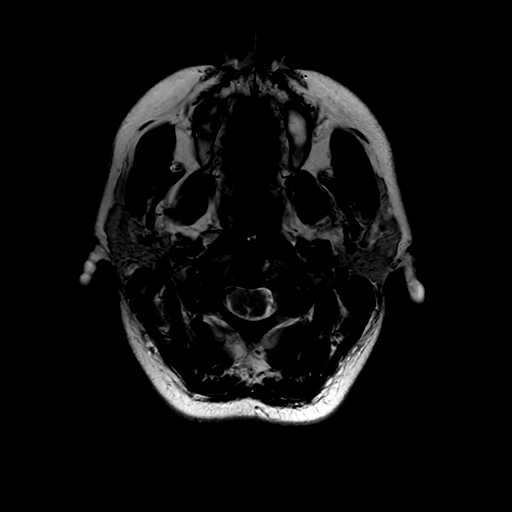
[im 25/25]
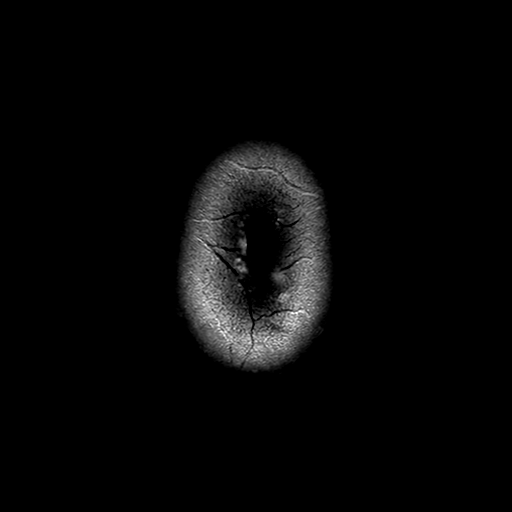

[Series 6: FLAIR · axial · 3.0mm · 0.45mm/px · z∈[-109,+30]mm · 2 of 25 slices shown (2 of 2)]
[im 1/25]
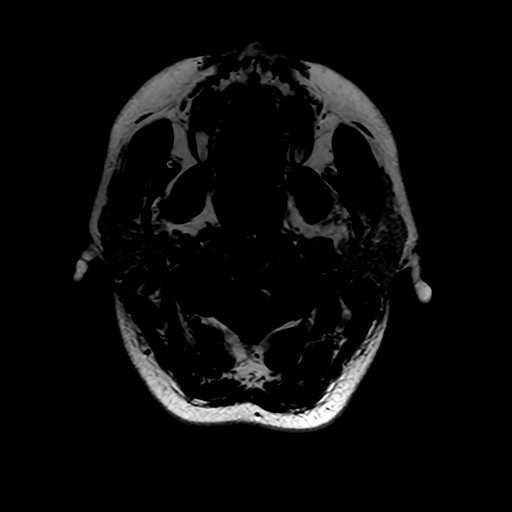
[im 25/25]
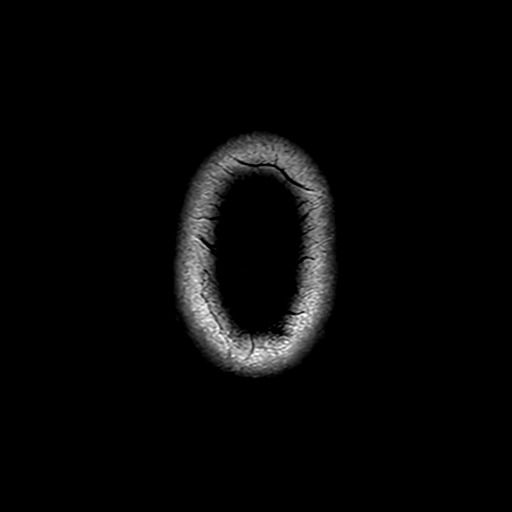

[Series 7: (person_name) · axial · 3.0mm · 0.47mm/px · z∈[-113,-52]mm · 4 of 100 slices shown]
[im 1/100]
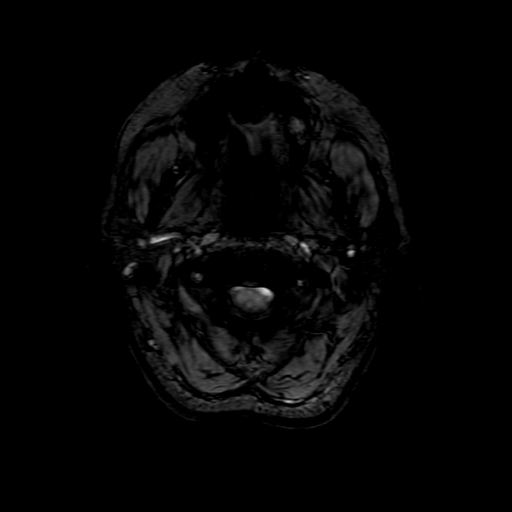
[im 15/100]
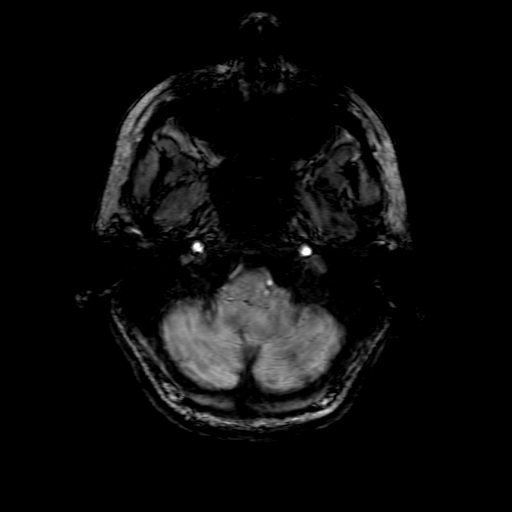
[im 29/100]
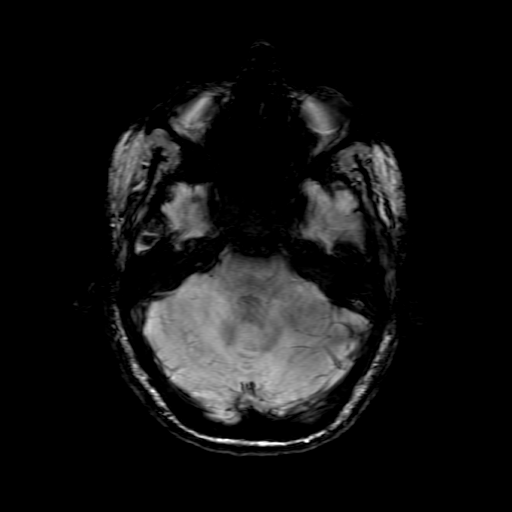
[im 43/100]
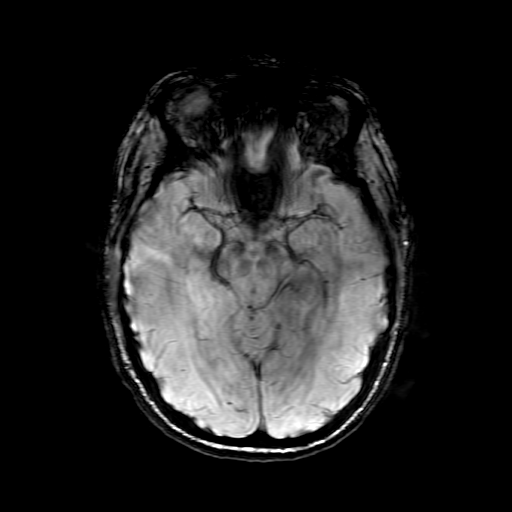

[Series 9: T2 · coronal · 5.0mm · 0.39mm/px · 2 of 31 slices shown (2 of 2)]
[im 1/31]
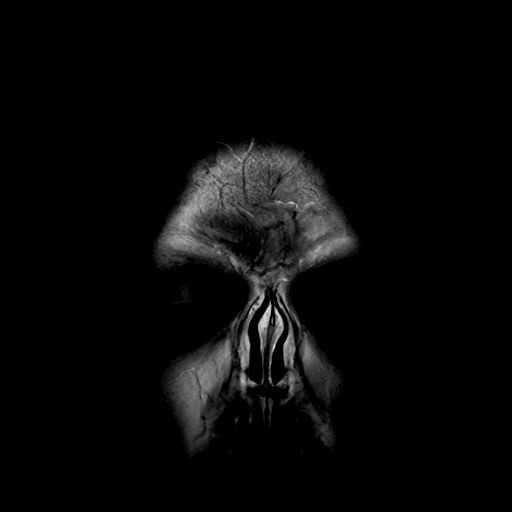
[im 31/31]
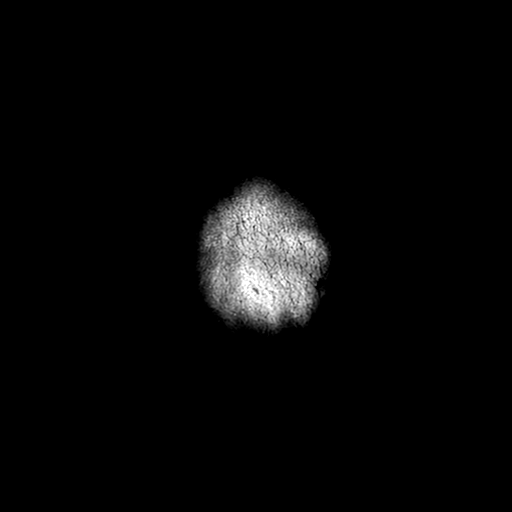

[Series 250: ADC · axial · 3.0mm · 0.94mm/px · z∈[-113,+29]mm · 4 of 50 slices shown (1 of 2)]
[im 1/50]
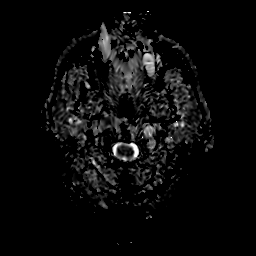
[im 17/50]
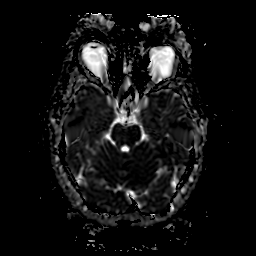
[im 33/50]
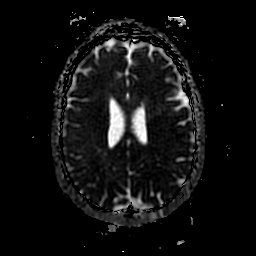
[im 50/50]
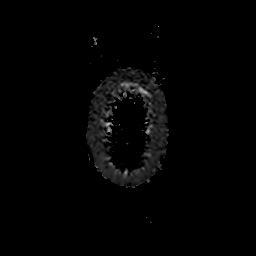

[Series 350: ADC · coronal · 4.0mm · 0.94mm/px · 3 of 37 slices shown (2 of 2)]
[im 1/37]
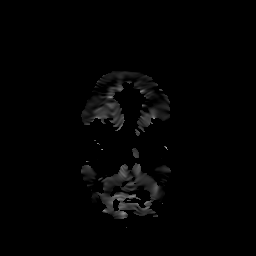
[im 19/37]
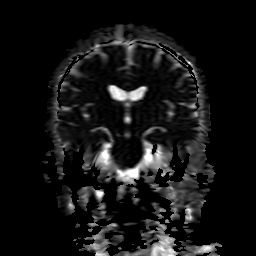
[im 37/37]
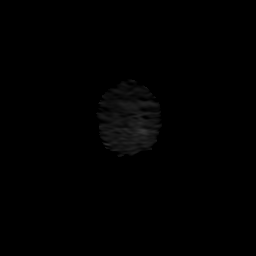

[32 of 48 positions shown; findings below may reference images not displayed]

FINDINGS: Brain: There is bilateral cerebellar tonsillar ectopia, and the
tonsils are somewhat pegged (series 4, image 13). But there is no
associated signal abnormality at the cervicomedullary junction. The
other basilar cisterns appear normal, and the visible cervical
spinal cord appears normal.

No restricted diffusion to suggest acute infarction. No midline
shift, mass effect, evidence of mass lesion, ventriculomegaly,
extra-axial collection or acute intracranial hemorrhage. Pituitary
appears normal. Normal suprasellar cistern.

Gray and white matter signal is within normal limits throughout the
brain. No encephalomalacia or chronic cerebral blood products. No
evidence of dural thickening on this noncontrast exam.

Vascular: Major intracranial vascular flow voids are preserved.

Skull and upper cervical spine: Aside from cerebellar tonsillar
ectopia the visible cervical spine appears normal. Visualized bone
marrow signal is within normal limits.

Sinuses/Orbits: Negative orbits. Mild to moderate bilateral
maxillary sinus mucosal thickening with some superimposed
fluid/bubbly opacity. But the remaining paranasal sinuses are well
aerated; minor mucosal thickening at the right frontoethmoidal
recess.

Other: Mastoids are clear. Visible internal auditory structures
appear normal. Normal stylomastoid foramina. Visible scalp and face
soft tissues appear negative.
IMPRESSION: 1. Cerebellar tonsillar ectopia, with a pegged appearance of the
tonsils.
But no associated signal abnormality in the cerebellum or brainstem.
And the other basilar cisterns and the visible cervical spine appear
normal.
Favor Chiari 1 malformation, with no related findings of idiopathic
intracranial hypo- or hypertension (pseudotumor cerebri).

2. Otherwise normal noncontrast MRI appearance of the brain.
Negative for acute or chronic ischemia.

3. Mild to moderate bilateral maxillary sinus inflammation.

## 2022-05-21 ENCOUNTER — Ambulatory Visit: Payer: No Typology Code available for payment source | Admitting: Podiatry

## 2022-08-14 DIAGNOSIS — R87615 Unsatisfactory cytologic smear of cervix: Secondary | ICD-10-CM | POA: Diagnosis not present

## 2022-09-12 DIAGNOSIS — Z79899 Other long term (current) drug therapy: Secondary | ICD-10-CM | POA: Diagnosis not present

## 2022-09-12 DIAGNOSIS — M7061 Trochanteric bursitis, right hip: Secondary | ICD-10-CM | POA: Diagnosis not present

## 2022-09-12 DIAGNOSIS — R5383 Other fatigue: Secondary | ICD-10-CM | POA: Diagnosis not present

## 2022-09-12 DIAGNOSIS — M7062 Trochanteric bursitis, left hip: Secondary | ICD-10-CM | POA: Diagnosis not present

## 2022-09-18 DIAGNOSIS — I10 Essential (primary) hypertension: Secondary | ICD-10-CM | POA: Diagnosis not present

## 2022-09-18 DIAGNOSIS — I700 Atherosclerosis of aorta: Secondary | ICD-10-CM | POA: Diagnosis not present

## 2022-09-18 DIAGNOSIS — E611 Iron deficiency: Secondary | ICD-10-CM | POA: Diagnosis not present

## 2022-09-18 DIAGNOSIS — E782 Mixed hyperlipidemia: Secondary | ICD-10-CM | POA: Diagnosis not present

## 2022-12-10 DIAGNOSIS — N926 Irregular menstruation, unspecified: Secondary | ICD-10-CM | POA: Diagnosis not present

## 2023-04-28 DIAGNOSIS — J029 Acute pharyngitis, unspecified: Secondary | ICD-10-CM | POA: Diagnosis not present

## 2023-04-28 DIAGNOSIS — H60502 Unspecified acute noninfective otitis externa, left ear: Secondary | ICD-10-CM | POA: Diagnosis not present

## 2023-05-14 DIAGNOSIS — J019 Acute sinusitis, unspecified: Secondary | ICD-10-CM | POA: Diagnosis not present

## 2023-05-14 DIAGNOSIS — H6692 Otitis media, unspecified, left ear: Secondary | ICD-10-CM | POA: Diagnosis not present

## 2023-05-20 DIAGNOSIS — H60502 Unspecified acute noninfective otitis externa, left ear: Secondary | ICD-10-CM | POA: Diagnosis not present

## 2023-06-04 ENCOUNTER — Ambulatory Visit
Admission: RE | Admit: 2023-06-04 | Discharge: 2023-06-04 | Disposition: A | Discharge status: Home / Self Care | Payer: BC Managed Care – PPO | Source: Ambulatory Visit | Attending: Internal Medicine | Admitting: Internal Medicine

## 2023-06-04 ENCOUNTER — Other Ambulatory Visit: Payer: Self-pay | Admitting: Internal Medicine

## 2023-06-04 DIAGNOSIS — J329 Chronic sinusitis, unspecified: Secondary | ICD-10-CM

## 2023-06-04 DIAGNOSIS — M26609 Unspecified temporomandibular joint disorder, unspecified side: Secondary | ICD-10-CM | POA: Diagnosis not present

## 2023-06-04 DIAGNOSIS — J0191 Acute recurrent sinusitis, unspecified: Secondary | ICD-10-CM | POA: Diagnosis not present

## 2023-06-04 DIAGNOSIS — H9202 Otalgia, left ear: Secondary | ICD-10-CM | POA: Diagnosis not present

## 2023-07-24 DIAGNOSIS — J0100 Acute maxillary sinusitis, unspecified: Secondary | ICD-10-CM | POA: Diagnosis not present

## 2023-07-24 DIAGNOSIS — J3489 Other specified disorders of nose and nasal sinuses: Secondary | ICD-10-CM | POA: Diagnosis not present

## 2023-09-16 DIAGNOSIS — S2341XA Sprain of ribs, initial encounter: Secondary | ICD-10-CM | POA: Diagnosis not present

## 2023-09-16 DIAGNOSIS — R0782 Intercostal pain: Secondary | ICD-10-CM | POA: Diagnosis not present

## 2023-10-30 ENCOUNTER — Emergency Department (HOSPITAL_COMMUNITY)

## 2023-10-30 ENCOUNTER — Other Ambulatory Visit: Payer: Self-pay

## 2023-10-30 ENCOUNTER — Emergency Department (HOSPITAL_COMMUNITY)
Admission: EM | Admit: 2023-10-30 | Discharge: 2023-10-30 | Disposition: A | Discharge status: Home / Self Care | Attending: Emergency Medicine | Admitting: Emergency Medicine

## 2023-10-30 ENCOUNTER — Encounter (HOSPITAL_COMMUNITY): Payer: Self-pay | Admitting: Emergency Medicine

## 2023-10-30 DIAGNOSIS — Z23 Encounter for immunization: Secondary | ICD-10-CM | POA: Diagnosis not present

## 2023-10-30 DIAGNOSIS — S0991XA Unspecified injury of ear, initial encounter: Secondary | ICD-10-CM | POA: Diagnosis present

## 2023-10-30 DIAGNOSIS — Y990 Civilian activity done for income or pay: Secondary | ICD-10-CM | POA: Diagnosis not present

## 2023-10-30 DIAGNOSIS — W19XXXA Unspecified fall, initial encounter: Secondary | ICD-10-CM

## 2023-10-30 DIAGNOSIS — W108XXA Fall (on) (from) other stairs and steps, initial encounter: Secondary | ICD-10-CM | POA: Diagnosis not present

## 2023-10-30 DIAGNOSIS — S0990XA Unspecified injury of head, initial encounter: Secondary | ICD-10-CM | POA: Insufficient documentation

## 2023-10-30 DIAGNOSIS — S01312A Laceration without foreign body of left ear, initial encounter: Secondary | ICD-10-CM | POA: Diagnosis not present

## 2023-10-30 MED ORDER — TETANUS-DIPHTH-ACELL PERTUSSIS 5-2.5-18.5 LF-MCG/0.5 IM SUSY
0.5000 mL | PREFILLED_SYRINGE | Freq: Once | INTRAMUSCULAR | Status: AC
  Administered 2023-10-30: 0.5 mL via INTRAMUSCULAR
  Filled 2023-10-30: qty 0.5

## 2023-10-30 NOTE — ED Provider Notes (Signed)
  EMERGENCY DEPARTMENT AT [facility] Provider Note   CSN: [id number] Arrival date & time: 10/30/23  4098     History  Chief Complaint  Patient presents with   Marletta Lor    Sandra Ashley is a 53 y.o. female.  HPI Adult female presents after a fall.  Patient is generally well, takes statin, otherwise no chronic medications.  She was in her usual state of health when she slipped fell 3 steps struck her head.  No loss of consciousness.  No weakness in any extremity.  She did sustain an injury to her left ear, and has pain throughout the left lateral neck, left jaw, and head.    Home Medications Prior to Admission medications   Medication Sig Start Date End Date Taking? Authorizing Provider  acetaminophen (TYLENOL) 500 MG tablet Take 1,000 mg by mouth every 6 (six) hours as needed for moderate pain or headache.   Yes [provider]  albuterol (VENTOLIN HFA) 108 (90 Base) MCG/ACT inhaler Inhale 2 puffs into the lungs every 4 (four) hours as needed for shortness of breath. 09/20/20  Yes [provider]  Bacillus Coagulans-Inulin (PROBIOTIC-PREBIOTIC PO) Take 2 capsules by mouth daily.   Yes [provider]  Drospirenone (SLYND) 4 MG TABS Take 4 mg by mouth daily.   Yes [provider]  escitalopram (LEXAPRO) 10 MG tablet Take 1 tablet by mouth daily. 07/13/23  Yes [provider]  fluticasone (FLONASE) 50 MCG/ACT nasal spray Place 1 spray into both nostrils daily.   Yes [provider]  Multiple Vitamins-Minerals (ADULT GUMMY PO) Take 2 capsules by mouth daily.   Yes [provider]  Multiple Vitamins-Minerals (HAIR/SKIN/NAILS) CAPS Take 2 capsules by mouth daily.   Yes [provider]  omeprazole (PRILOSEC OTC) 20 MG tablet Take 20 mg by mouth daily.   Yes [provider]  simvastatin (ZOCOR) 10 MG tablet Take 10 mg by mouth at bedtime. 09/20/20  Yes [provider]      Allergies     Ibuprofen, Hyoscyamine sulfate, and Zithromax [azithromycin]    Review of Systems   Review of Systems  Physical Exam Updated Vital Signs BP 119/70   Pulse (!) 58   Temp 98 F (36.7 C) (Oral)   Resp 16   Ht 5\' 4"  (1.626 m)   Wt 103.4 kg   SpO2 100%   BMI 39.14 kg/m  Physical Exam Vitals and nursing note reviewed.  Constitutional:      General: She is not in acute distress.    Appearance: She is well-developed.  HENT:     Head: Normocephalic.   Eyes:     Conjunctiva/sclera: Conjunctivae normal.  Cardiovascular:     Rate and Rhythm: Normal rate and regular rhythm.     Pulses: Normal pulses.  Pulmonary:     Effort: Pulmonary effort is normal. No respiratory distress.     Breath sounds: No stridor.  Abdominal:     General: There is no distension.  Skin:    General: Skin is warm and dry.  Neurological:     General: No focal deficit present.     Mental Status: She is alert and oriented to person, place, and time.     Cranial Nerves: No cranial nerve deficit.  Psychiatric:        Mood and Affect: Mood normal.     ED Results / Procedures / Treatments   Labs (all labs ordered are listed, but only abnormal results are  displayed) Labs Reviewed - No data to display  EKG None  Radiology CT HEAD WO CONTRAST Result Date: 10/30/2023 CLINICAL DATA:  Head trauma, moderate-severe; Polytrauma, blunt. Mechanical fall from a 3 foot ladder, hitting the head on a counter. Jaw and head pain. EXAM: CT HEAD WITHOUT CONTRAST CT CERVICAL SPINE WITHOUT CONTRAST TECHNIQUE: Multidetector CT imaging of the head and cervical spine was performed following the standard protocol without intravenous contrast. Multiplanar CT image reconstructions of the cervical spine were also generated. RADIATION DOSE REDUCTION: This exam was performed according to the departmental dose-optimization program which includes automated exposure control, adjustment of the mA and/or kV according to patient size and/or  use of iterative reconstruction technique. COMPARISON:  Head MRI 10/01/2020 FINDINGS: CT HEAD FINDINGS Brain: There is no evidence of an acute infarct, intracranial hemorrhage, mass, midline shift, or extra-axial fluid collection. Cerebral volume is normal. The ventricles are normal in size. 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 Vascular: No hyperdense vessel. Skull: No acute fracture or suspicious lesion. Sinuses/Orbits: Essentially complete opacification of the right frontal sinus and included superior portions of both maxillary sinuses clear mastoid air cells. Unremarkable orbits. Other: None. CT CERVICAL SPINE FINDINGS Alignment: Reversal of the normal cervical lordosis. No evidence of traumatic subluxation. Skull base and vertebrae: No acute fracture or suspicious lesion. Soft tissues and spinal canal: No prevertebral fluid or swelling. No visible canal hematoma. Disc levels: Mild cervical spondylosis. No evidence of high-grade stenosis. Upper chest: Unremarkable. Other: None. IMPRESSION: No evidence of acute intracranial or cervical spine injury. Electronically Signed   By: Sebastian Ache M.D.   On: 10/30/2023 08:52   CT CERVICAL SPINE WO CONTRAST Result Date: 10/30/2023 CLINICAL DATA:  Head trauma, moderate-severe; Polytrauma, blunt. Mechanical fall from a 3 foot ladder, hitting the head on a counter. Jaw and head pain. EXAM: CT HEAD WITHOUT CONTRAST CT CERVICAL SPINE WITHOUT CONTRAST TECHNIQUE: Multidetector CT imaging of the head and cervical spine was performed following the standard protocol without intravenous contrast. Multiplanar CT image reconstructions of the cervical spine were also generated. RADIATION DOSE REDUCTION: This exam was performed according to the departmental dose-optimization program which includes automated exposure control, adjustment of the mA and/or kV according to patient size and/or use of iterative reconstruction technique. COMPARISON:  Head MRI  10/01/2020 FINDINGS: CT HEAD FINDINGS Brain: There is no evidence of an acute infarct, intracranial hemorrhage, mass, midline shift, or extra-axial fluid collection. Cerebral volume is normal. The ventricles are normal in size. 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 Vascular: No hyperdense vessel. Skull: No acute fracture or suspicious lesion. Sinuses/Orbits: Essentially complete opacification of the right frontal sinus and included superior portions of both maxillary sinuses clear mastoid air cells. Unremarkable orbits. Other: None. CT CERVICAL SPINE FINDINGS Alignment: Reversal of the normal cervical lordosis. No evidence of traumatic subluxation. Skull base and vertebrae: No acute fracture or suspicious lesion. Soft tissues and spinal canal: No prevertebral fluid or swelling. No visible canal hematoma. Disc levels: Mild cervical spondylosis. No evidence of high-grade stenosis. Upper chest: Unremarkable. Other: None. IMPRESSION: No evidence of acute intracranial or cervical spine injury. Electronically Signed   By: Sebastian Ache M.D.   On: 10/30/2023 08:52    Procedures Procedures    Medications Ordered in ED Medications  Tdap (BOOSTRIX) injection 0.5 mL (0.5 mLs Intramuscular Given 10/30/23 3086)    ED Course/ Medical Decision Making/ A&P  Medical Decision Making Patient presents after mechanical fall with subsequent development of pain in her head, neck, left face.  Patient is awake and alert, has no obvious malocclusion though she does describe TMJ pain.  Pulses unremarkable, and denial of any extremity weakness, or any focal neurodeficits is somewhat reassuring.  Given concern for trauma, including obvious open wound on the year, patient had imaging performed, received tetanus monitoring.   Amount and/or Complexity of Data Reviewed External Data Reviewed: notes. Radiology: ordered and independent interpretation performed.  Decision-making details documented in ED Course.  Risk Prescription drug management.   On repeat exam patient in similar condition, and accompanied by her husband.  We reviewed the patient's fall, findings, CTs which are generally reassuring, no current hemorrhage, no skull fracture, neck fracture.  With regard to the patient's laceration of discussed patient's case with our ENT colleague, Dr. Pollyann Kennedy.  Given the complexity of the anterior laceration patient will proceed from here to the ENT clinic for repair.  Patient, husband aware of plan, other findings that are reassuring, patient discharged in stable condition.        Final Clinical Impression(s) / ED Diagnoses Final diagnoses:  Fall, initial encounter  Laceration of left earlobe, initial encounter  Injury of head, initial encounter    Rx / DC Orders ED Discharge Orders     None         Gerhard Munch, MD 10/30/23 [phone]

## 2023-10-30 NOTE — Discharge Instructions (Signed)
 Be sure to proceed to our colleagues office across the street.  Return here for concerning changes in your condition.  Your evaluation today has been otherwise reassuring, with no current evidence for intracranial injury, or neck fracture.  However, if you develop new, or concerning changes do not hesitate to return here.

## 2023-10-30 NOTE — ED Triage Notes (Signed)
 Patient reports mechanical fall off 3 foot ladder and hit head on edge of counter. Denies loc. Denies blood thinners. Complaining of pain to jaw, head, and right ankle.

## 2023-11-11 DIAGNOSIS — Z1231 Encounter for screening mammogram for malignant neoplasm of breast: Secondary | ICD-10-CM | POA: Diagnosis not present

## 2023-11-11 DIAGNOSIS — Z01419 Encounter for gynecological examination (general) (routine) without abnormal findings: Secondary | ICD-10-CM | POA: Diagnosis not present

## 2023-11-11 DIAGNOSIS — Z1331 Encounter for screening for depression: Secondary | ICD-10-CM | POA: Diagnosis not present

## 2023-12-22 DIAGNOSIS — R0602 Shortness of breath: Secondary | ICD-10-CM | POA: Diagnosis not present

## 2024-03-10 DIAGNOSIS — R5383 Other fatigue: Secondary | ICD-10-CM | POA: Diagnosis not present

## 2024-07-08 DIAGNOSIS — F331 Major depressive disorder, recurrent, moderate: Secondary | ICD-10-CM | POA: Diagnosis not present

## 2024-07-08 DIAGNOSIS — D509 Iron deficiency anemia, unspecified: Secondary | ICD-10-CM | POA: Diagnosis not present

## 2024-07-08 DIAGNOSIS — F411 Generalized anxiety disorder: Secondary | ICD-10-CM | POA: Diagnosis not present

## 2024-07-08 DIAGNOSIS — Z23 Encounter for immunization: Secondary | ICD-10-CM | POA: Diagnosis not present

## 2024-07-08 DIAGNOSIS — R7303 Prediabetes: Secondary | ICD-10-CM | POA: Diagnosis not present

## 2024-07-12 DIAGNOSIS — E1169 Type 2 diabetes mellitus with other specified complication: Secondary | ICD-10-CM | POA: Diagnosis not present

## 2024-07-12 DIAGNOSIS — F331 Major depressive disorder, recurrent, moderate: Secondary | ICD-10-CM | POA: Diagnosis not present
